# Patient Record
Sex: Female | Born: 2000 | Race: White | Hispanic: No | Marital: Single | Smoking: Current every day smoker
Health system: Southern US, Community
[De-identification: ages and names within clinical notes are randomized; demographics above are authoritative.]

## PROBLEM LIST (undated history)

## (undated) DIAGNOSIS — Z789 Other specified health status: Secondary | ICD-10-CM

## (undated) DIAGNOSIS — O24419 Gestational diabetes mellitus in pregnancy, unspecified control: Secondary | ICD-10-CM

## (undated) HISTORY — DX: Other specified health status: Z78.9

## (undated) HISTORY — PX: NO PAST SURGERIES: SHX2092

## (undated) HISTORY — DX: Gestational diabetes mellitus in pregnancy, unspecified control: O24.419

---
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
## 2018-03-31 ENCOUNTER — Encounter: Payer: Self-pay | Admitting: Obstetrics and Gynecology

## 2018-04-09 ENCOUNTER — Other Ambulatory Visit (HOSPITAL_COMMUNITY)
Admission: RE | Admit: 2018-04-09 | Discharge: 2018-04-09 | Disposition: A | Discharge status: Home / Self Care | Payer: BLUE CROSS/BLUE SHIELD | Source: Ambulatory Visit | Attending: Obstetrics and Gynecology | Admitting: Obstetrics and Gynecology

## 2018-04-09 ENCOUNTER — Encounter: Payer: Self-pay | Admitting: Obstetrics and Gynecology

## 2018-04-09 ENCOUNTER — Ambulatory Visit (INDEPENDENT_AMBULATORY_CARE_PROVIDER_SITE_OTHER): Payer: BLUE CROSS/BLUE SHIELD | Admitting: Obstetrics and Gynecology

## 2018-04-09 VITALS — BP 120/74 | HR 83 | Ht 68.0 in | Wt 183.0 lb

## 2018-04-09 DIAGNOSIS — Z113 Encounter for screening for infections with a predominantly sexual mode of transmission: Secondary | ICD-10-CM | POA: Diagnosis not present

## 2018-04-09 DIAGNOSIS — Z30431 Encounter for routine checking of intrauterine contraceptive device: Secondary | ICD-10-CM | POA: Diagnosis not present

## 2018-04-09 NOTE — Progress Notes (Signed)
Patient, No Pcp Per   Chief Complaint  Patient presents with  . Exposure to STD    HPI:      [name] is a 17 y.o. G0P0000 who LMP was Patient's last menstrual period was 03/20/2018 (approximate)., presents today for STD testing. No known exposures/sx, but just wants to be safe. No pelvic pain/vag sx. She is sex active, not using condoms. Has Verdia Kuba, doing well with it. No hx of STDs. Annual due later this month.   Past Medical History:  Diagnosis Date  . No known health problems     Past Surgical History:  Procedure Laterality Date  . NO PAST SURGERIES      Family History  Problem Relation Age of Onset  . Diabetes Mother   . Hypertension Mother   . Diabetes Father   . Hypertension Father   . Cancer Neg Hx   . Thyroid disease Neg Hx   . Hyperlipidemia Neg Hx     Social History   Socioeconomic History  . Marital status: Single    Spouse name: Not on file  . Number of children: 0  . Years of education: 83  . Highest education level: Not on file  Occupational History  . Not on file  Social Needs  . Financial resource strain: Not on file  . Food insecurity:    Worry: Not on file    Inability: Not on file  . Transportation needs:    Medical: Not on file    Non-medical: Not on file  Tobacco Use  . Smoking status: Current Every Day Smoker  . Smokeless tobacco: Never Used  . Tobacco comment: 6-7qd  Substance and Sexual Activity  . Alcohol use: No  . Drug use: No  . Sexual activity: Yes    Birth control/protection: IUD    Comment: [place]  . Physical activity:    Days per week: Not on file    Minutes per session: Not on file  . Stress: Not on file  Relationships  . Social connections:    Talks on phone: Not on file    Gets together: Not on file    Attends religious service: Not on file    Active member of club or organization: Not on file    Attends meetings of clubs or organizations: Not on file    Relationship status: Not  on file  . Intimate partner violence:    Fear of current or ex partner: Not on file    Emotionally abused: Not on file    Physically abused: Not on file    Forced sexual activity: Not on file  Other Topics Concern  . Not on file  Social History Narrative  . Not on file    Outpatient Medications Prior to Visit  Medication Sig Dispense Refill  . Levonorgestrel (KYLEENA) 19.5 MG IUD 19.5 mg by Intrauterine route once. 1 Intra Uterine Device 0   No facility-administered medications prior to visit.       ROS:  Review of Systems  Constitutional: Negative for fever.  Gastrointestinal: Negative for blood in stool, constipation, diarrhea, nausea and vomiting.  Genitourinary: Negative for dyspareunia, dysuria, flank pain, frequency, hematuria, urgency, vaginal bleeding, vaginal discharge and vaginal pain.  Musculoskeletal: Negative for back pain.  Skin: Negative for rash.   BREAST: No symptoms   OBJECTIVE:   Vitals:  BP 120/74   Pulse 83   Ht 5\' 8"  (1.727 m)   Wt 183 lb (  83 kg)   LMP 03/20/2018 (Approximate)   BMI 27.83 kg/m   Physical Exam  Constitutional: She is oriented to person, place, and time. Vital signs are normal. She appears well-developed.  Pulmonary/Chest: Effort normal.  Genitourinary: Vagina normal and uterus normal. There is no rash, tenderness or lesion on the right labia. There is no rash, tenderness or lesion on the left labia. Uterus is not enlarged and not tender. Cervix exhibits no motion tenderness. Right adnexum displays no mass and no tenderness. Left adnexum displays no mass and no tenderness. No erythema or tenderness in the vagina. No vaginal discharge found.  Genitourinary Comments: IUD STRINGS IN OS  Musculoskeletal: Normal range of motion.  Neurological: She is alert and oriented to person, place, and time.  Psychiatric: She has a normal mood and affect. Her behavior is normal. Thought content normal.  Vitals  reviewed.   Assessment/Plan: Screening for STD (sexually transmitted disease) - No sx. Will call with results.  - Plan: Cervicovaginal ancillary only, HIV antibody, RPR, Hepatitis C antibody  Encounter for routine checking of intrauterine contraceptive device (IUD) - IUD in place.    Return in about 1 month (around 02/03/6269) for annual.  Roberto Hlavaty B. Saphronia Ozdemir, PA-C 04/09/2018 10:36 AM

## 2018-04-09 NOTE — Patient Instructions (Signed)
I value your feedback and entrusting us with your care. If you get a [place] patient survey, I would appreciate you taking the time to let us know about your experience today. Thank you! 

## 2018-04-10 LAB — HIV ANTIBODY (ROUTINE TESTING W REFLEX): HIV SCREEN 4TH GENERATION: NONREACTIVE

## 2018-04-10 LAB — HEPATITIS C ANTIBODY

## 2018-04-10 LAB — CERVICOVAGINAL ANCILLARY ONLY
CHLAMYDIA, DNA PROBE: NEGATIVE
NEISSERIA GONORRHEA: NEGATIVE
Trichomonas: NEGATIVE

## 2018-04-10 LAB — RPR: RPR: NONREACTIVE

## 2018-12-10 ENCOUNTER — Other Ambulatory Visit: Payer: Self-pay

## 2018-12-10 ENCOUNTER — Emergency Department
Admission: EM | Admit: 2018-12-10 | Discharge: 2018-12-10 | Disposition: A | Discharge status: Home / Self Care | Payer: BLUE CROSS/BLUE SHIELD | Attending: Emergency Medicine | Admitting: Emergency Medicine

## 2018-12-10 ENCOUNTER — Encounter (HOSPITAL_COMMUNITY): Payer: Self-pay | Admitting: Emergency Medicine

## 2018-12-10 ENCOUNTER — Emergency Department (HOSPITAL_COMMUNITY)
Admission: EM | Admit: 2018-12-10 | Discharge: 2018-12-10 | Disposition: A | Discharge status: Home / Self Care | Payer: BLUE CROSS/BLUE SHIELD | Attending: Emergency Medicine | Admitting: Emergency Medicine

## 2018-12-10 DIAGNOSIS — F17200 Nicotine dependence, unspecified, uncomplicated: Secondary | ICD-10-CM | POA: Insufficient documentation

## 2018-12-10 DIAGNOSIS — Z5321 Procedure and treatment not carried out due to patient leaving prior to being seen by health care provider: Secondary | ICD-10-CM | POA: Insufficient documentation

## 2018-12-10 DIAGNOSIS — K625 Hemorrhage of anus and rectum: Secondary | ICD-10-CM | POA: Insufficient documentation

## 2018-12-10 DIAGNOSIS — M549 Dorsalgia, unspecified: Secondary | ICD-10-CM | POA: Insufficient documentation

## 2018-12-10 DIAGNOSIS — Z79899 Other long term (current) drug therapy: Secondary | ICD-10-CM | POA: Diagnosis not present

## 2018-12-10 LAB — COMPREHENSIVE METABOLIC PANEL
ALBUMIN: 4.6 g/dL (ref 3.5–5.0)
ALT: 15 U/L (ref 0–44)
ANION GAP: 7 (ref 5–15)
AST: 16 U/L (ref 15–41)
Alkaline Phosphatase: 46 U/L (ref 38–126)
BILIRUBIN TOTAL: 0.8 mg/dL (ref 0.3–1.2)
BUN: 14 mg/dL (ref 6–20)
CO2: 26 mmol/L (ref 22–32)
Calcium: 9.3 mg/dL (ref 8.9–10.3)
Chloride: 106 mmol/L (ref 98–111)
Creatinine, Ser: 0.63 mg/dL (ref 0.44–1.00)
GFR calc Af Amer: >60 mL/min (ref >60)
GFR calc non Af Amer: >60 mL/min (ref >60)
GLUCOSE: 97 mg/dL (ref 70–99)
POTASSIUM: 3.9 mmol/L (ref 3.5–5.1)
SODIUM: 139 mmol/L (ref 135–145)
Total Protein: 7.3 g/dL (ref 6.5–8.1)

## 2018-12-10 LAB — URINALYSIS, COMPLETE (UACMP) WITH MICROSCOPIC
BACTERIA UA: NONE SEEN
BILIRUBIN URINE: NEGATIVE
Glucose, UA: NEGATIVE mg/dL
Hgb urine dipstick: NEGATIVE
KETONES UR: NEGATIVE mg/dL
LEUKOCYTE UA: NEGATIVE
Nitrite: NEGATIVE
PROTEIN: NEGATIVE mg/dL
Specific Gravity, Urine: 1.021 (ref 1.005–1.030)
pH: 6 (ref 5.0–8.0)

## 2018-12-10 LAB — POCT PREGNANCY, URINE: PREG TEST UR: NEGATIVE

## 2018-12-10 LAB — CBC
HEMATOCRIT: 41.1 % (ref 36.0–46.0)
HEMOGLOBIN: 13.9 g/dL (ref 12.0–15.0)
MCH: 30.2 pg (ref 26.0–34.0)
MCHC: 33.8 g/dL (ref 30.0–36.0)
MCV: 89.2 fL (ref 80.0–100.0)
NRBC: 0 % (ref 0.0–0.2)
Platelets: 254 10*3/uL (ref 150–400)
RBC: 4.61 MIL/uL (ref 3.87–5.11)
RDW: 11.4 % — ABNORMAL LOW (ref 11.5–15.5)
WBC: 10.8 10*3/uL — AB (ref 4.0–10.5)

## 2018-12-10 MED ORDER — SUCRALFATE 1 G PO TABS: 1.0000 g | ORAL_TABLET | Freq: Four times a day (QID) | ORAL | 0 refills | Status: DC

## 2018-12-10 MED ORDER — DICYCLOMINE HCL 20 MG PO TABS: 20.0000 mg | ORAL_TABLET | Freq: Three times a day (TID) | ORAL | 0 refills | Status: DC | PRN

## 2018-12-10 NOTE — ED Triage Notes (Addendum)
Pt states she had 1 episode of rectal bleeding today x 1. States "it was toilet full of blood".  States no hx of the same states felt like it was watery stool, no recent illness. States did have some abd cramping yesterday not today. Denies any dysuria, or fever. Pt denies any rectal pain or hx of hemorrhoids.

## 2018-12-10 NOTE — ED Provider Notes (Signed)
[facility] Emergency Department Provider Note  ____________________________________________   I have reviewed the triage vital signs and the nursing notes.   HISTORY  Chief Complaint Rectal Bleeding   History limited by: Not Limited   HPI Dana Murphy is a 18 y.o. female who presents to the emergency department today because of concerns for an episode of rectal bleeding.  The patient states that she had one episode of bloody diarrhea today.  She is not had any further bowel movements.  She did not have any associated abdominal pain or cramping today although stated she had a small episode of cramping yesterday.  She denies any nausea or vomiting today.  Denies any fevers.  Denies any known sick contacts or recent travels or unusual ingestions.  Patient denies any history of IBS or Crohn's or any family history of the same.  She states she does not typically strain on the toilet and is not on the toilet for prolonged periods.  Past Medical History:  Diagnosis Date  . No known health problems     There are no active problems to display for this patient.   Past Surgical History:  Procedure Laterality Date  . NO PAST SURGERIES      Prior to Admission medications   Medication Sig Start Date End Date Taking? Authorizing Provider  Levonorgestrel (KYLEENA) 19.5 MG IUD 19.5 mg by Intrauterine route once. 04/27/17 04/13/44  Copland, Deirdre Evener, PA-C    Allergies Patient has no known allergies.  Family History  Problem Relation Age of Onset  . Diabetes Mother   . Hypertension Mother   . Diabetes Father   . Hypertension Father   . Cancer Neg Hx   . Thyroid disease Neg Hx   . Hyperlipidemia Neg Hx     Social History Social History   Tobacco Use  . Smoking status: Current Every Day Smoker  . Smokeless tobacco: Never Used  . Tobacco comment: 6-7qd  Substance Use Topics  . Alcohol use: No  . Drug use: No    Review of Systems Constitutional: No  fever/chills Eyes: No visual changes. ENT: No sore throat. Cardiovascular: Denies chest pain. Respiratory: Denies shortness of breath. Gastrointestinal: Positive for abdominal cramping yesterday. Positive for bloody diarrhea.    Genitourinary: Negative for dysuria. Musculoskeletal: Negative for back pain. Skin: Negative for rash. Neurological: Negative for headaches, focal weakness or numbness.  ____________________________________________   PHYSICAL EXAM:  VITAL SIGNS: ED Triage Vitals [12/10/18 2018]  Enc Vitals Group     BP 95/78     Pulse Rate 81     Resp 20     Temp 98.3 F (36.8 C)     Temp Source Oral     SpO2 100 %     Weight 185 lb (83.9 kg)     Height 5\' 8"  (1.727 m)     Head Circumference      Peak Flow      Pain Score 4   Constitutional: Alert and oriented.  Eyes: Conjunctivae are normal.  ENT      Head: Normocephalic and atraumatic.      Nose: No congestion/rhinnorhea.      Mouth/Throat: Mucous membranes are moist.      Neck: No stridor. Hematological/Lymphatic/Immunilogical: No cervical lymphadenopathy. Cardiovascular: Normal rate, regular rhythm.  No murmurs, rubs, or gallops. Respiratory: Normal respiratory effort without tachypnea nor retractions. Breath sounds are clear and equal bilaterally. No wheezes/rales/rhonchi. Gastrointestinal: Soft and non tender. No rebound. No guarding.  Rectal: No anal fissure. No hemorrhoid appreciated. No blood on glove. RN Neomia Glass present during rectal exam.  Musculoskeletal: Normal range of motion in all extremities. No lower extremity edema. Neurologic:  Normal speech and language. No gross focal neurologic deficits are appreciated.  Skin:  Skin is warm, dry and intact. No rash noted. Psychiatric: Mood and affect are normal. Speech and behavior are normal. Patient exhibits appropriate insight and judgment.  ____________________________________________    LABS (pertinent positives/negatives)  CMP wnl Upreg  negative UA hazy, 6-10 wbc, 6-10 squamous epithelial CBC wbc 10.8, hgb 13.9, plt 254 ____________________________________________   EKG  None  ____________________________________________    RADIOLOGY  None  ____________________________________________   PROCEDURES  Procedures  ____________________________________________   INITIAL IMPRESSION / ASSESSMENT AND PLAN / ED COURSE  Pertinent labs & imaging results that were available during my care of the patient were reviewed by me and considered in my medical decision making (see chart for details).   Patient presented to the emergency department today because of concerns for bloody diarrhea.  Patient had 1 isolated episode.  Patient's blood work without any concerning electrolyte abnormalities, leukocytosis or anemia.  No hemorrhoids or anal fissures appreciated on exam.  At this point we do not have patient had gastroenteritis or possible food poisoning.  I discussed this with patient.  Also discussed possibility of IBS or planus type disease although sounds like patient does not have any family history of personal history of similar symptoms..  At this point I feel it is reasonable for patient be discharged home.  I did discuss return precautions with patient.   ____________________________________________   FINAL CLINICAL IMPRESSION(S) / ED DIAGNOSES  Final diagnoses:  Rectal bleeding     Note: This dictation was prepared with Dragon dictation. Any transcriptional errors that result from this process are unintentional     Nance Pear, MD 12/10/18 [id number]

## 2018-12-10 NOTE — ED Triage Notes (Signed)
Pt. Stated, Im dripping blood from my rectum. Im having some back pain, and stomach cramping.

## 2018-12-10 NOTE — Discharge Instructions (Addendum)
Please seek medical attention for any high fevers, chest pain, shortness of breath, change in behavior, persistent vomiting, bloody stool or any other new or concerning symptoms.  

## 2020-02-13 NOTE — Progress Notes (Signed)
Chief Complaint  Patient presents with  . Gynecologic Exam     HPI:      [name] is a 19 y.o. G0P0000 who LMP was No LMP recorded. (Menstrual status: IUD)., presents today for her annual examination.  Her menses are infrequent spotting with IUD. Dysmenorrhea none.   Sex activity: single partner, contraception - Kyleena placed 04/27/17.  Last pap: N/A in past due to age; due today Hx of STDs: none  There is no FH of breast cancer. There is no FH of ovarian cancer. She does SBE.  Tobacco use: used to smoke cigs, now vapes Alcohol use: social drinker  No drug use. Exercise: min active  She does get adequate calcium but not Vitamin D in her diet. Has had wt gain.   Gardasil series not done.  Past Medical History:  Diagnosis Date  . No known health problems     Past Surgical History:  Procedure Laterality Date  . NO PAST SURGERIES      Family History  Problem Relation Age of Onset  . Diabetes Mother   . Hypertension Mother   . Diabetes Father   . Hypertension Father   . Cancer Neg Hx   . Thyroid disease Neg Hx   . Hyperlipidemia Neg Hx     Social History   Socioeconomic History  . Marital status: Single    Spouse name: Not on file  . Number of children: 0  . Years of education: 65  . Highest education level: Not on file  Occupational History  . Not on file  Tobacco Use  . Smoking status: Current Every Day Smoker  . Smokeless tobacco: Never Used  . Tobacco comment: 6-7qd  Substance and Sexual Activity  . Alcohol use: No  . Drug use: No  . Sexual activity: Yes    Birth control/protection: I.U.D.    Comment: Kylenna  Other Topics Concern  . Not on file  Social History Narrative  . Not on file   Social Determinants of Health   Financial Resource Strain:   . Difficulty of Paying Living Expenses:   Food Insecurity:   . Worried About Charity fundraiser in the Last Year:   . Arboriculturist in the Last Year:   Transportation Needs:   .  Film/video editor (Medical):   Marland Kitchen Lack of Transportation (Non-Medical):   Physical Activity:   . Days of Exercise per Week:   . Minutes of Exercise per Session:   Stress:   . Feeling of Stress :   Social Connections:   . Frequency of Communication with Friends and Family:   . Frequency of Social Gatherings with Friends and Family:   . Attends Religious Services:   . Active Member of Clubs or Organizations:   . Attends Archivist Meetings:   Marland Kitchen Marital Status:   Intimate Partner Violence:   . Fear of Current or Ex-Partner:   . Emotionally Abused:   Marland Kitchen Physically Abused:   . Sexually Abused:      Current Outpatient Medications:  .  Levonorgestrel (KYLEENA) 19.5 MG IUD, 19.5 mg by Intrauterine route once., Disp: 1 Intra Uterine Device, Rfl: 0  ROS:  Review of Systems  Constitutional: Negative for fatigue, fever and unexpected weight change.  Respiratory: Negative for cough, shortness of breath and wheezing.   Cardiovascular: Negative for chest pain, palpitations and leg swelling.  Gastrointestinal: Negative for blood in stool, constipation, diarrhea, nausea and vomiting.  Endocrine: Negative for cold intolerance, heat intolerance and polyuria.  Genitourinary: Negative for dyspareunia, dysuria, flank pain, frequency, genital sores, hematuria, menstrual problem, pelvic pain, urgency, vaginal bleeding, vaginal discharge and vaginal pain.  Musculoskeletal: Negative for back pain, joint swelling and myalgias.  Skin: Negative for rash.  Neurological: Positive for headaches. Negative for dizziness, syncope, light-headedness and numbness.  Hematological: Negative for adenopathy.  Psychiatric/Behavioral: Negative for agitation, confusion, sleep disturbance and suicidal ideas. The patient is not nervous/anxious.      Objective: BP 120/80   Ht 5\' 9"  (1.753 m)   Wt 207 lb (93.9 kg)   BMI 30.57 kg/m    Physical Exam Constitutional:      Appearance: She is  well-developed.  Genitourinary:     Vulva, vagina, uterus, right adnexa and left adnexa normal.     No vulval lesion or tenderness noted.     No vaginal discharge, erythema or tenderness.     No cervical motion tenderness or polyp.     Uterus is not enlarged or tender.     No right or left adnexal mass present.     Right adnexa not tender.     Left adnexa not tender.  Neck:     Thyroid: Thyromegaly present.  Cardiovascular:     Rate and Rhythm: Normal rate and regular rhythm.     Heart sounds: Normal heart sounds. No murmur.  Pulmonary:     Effort: Pulmonary effort is normal.     Breath sounds: Normal breath sounds.  Chest:     Breasts:        Right: No mass, nipple discharge, skin change or tenderness.        Left: No mass, nipple discharge, skin change or tenderness.  Abdominal:     Palpations: Abdomen is soft.     Tenderness: There is no abdominal tenderness. There is no guarding.  Musculoskeletal:        General: Normal range of motion.     Cervical back: Normal range of motion.  Neurological:     General: No focal deficit present.     Mental Status: She is alert and oriented to person, place, and time.     Cranial Nerves: No cranial nerve deficit.  Skin:    General: Skin is warm and dry.  Psychiatric:        Mood and Affect: Mood normal.        Behavior: Behavior normal.        Thought Content: Thought content normal.        Judgment: Judgment normal.  Vitals reviewed.     Assessment/Plan: Encounter for annual routine gynecological examination  Cervical cancer screening - Plan: Cytology - PAP  Screening for STD (sexually transmitted disease) - Plan: Cytology - PAP  Encounter for routine checking of intrauterine contraceptive device (IUD); IUD in place. Due for rem 7/23.  Thyroid disorder screening - Plan: TSH + free T4  Enlarged thyroid--check labs. If neg, will check thyroid u/s. Has had wt gain.             GYN counsel adequate intake of calcium and  vitamin D, Gardasil vaccine.  Return in about 1 year (around 02/16/2021).   Kenlee Maler B. Yuvonne Lanahan, PA-C 02/17/2020 9:08 AM

## 2020-02-17 ENCOUNTER — Other Ambulatory Visit: Payer: Self-pay

## 2020-02-17 ENCOUNTER — Ambulatory Visit (INDEPENDENT_AMBULATORY_CARE_PROVIDER_SITE_OTHER): Payer: BC Managed Care – PPO | Admitting: Obstetrics and Gynecology

## 2020-02-17 ENCOUNTER — Encounter: Payer: Self-pay | Admitting: Obstetrics and Gynecology

## 2020-02-17 ENCOUNTER — Other Ambulatory Visit (HOSPITAL_COMMUNITY)
Admission: RE | Admit: 2020-02-17 | Discharge: 2020-02-17 | Disposition: A | Discharge status: Home / Self Care | Payer: BC Managed Care – PPO | Source: Ambulatory Visit | Attending: Obstetrics and Gynecology | Admitting: Obstetrics and Gynecology

## 2020-02-17 VITALS — BP 120/80 | Ht 69.0 in | Wt 207.0 lb

## 2020-02-17 DIAGNOSIS — Z124 Encounter for screening for malignant neoplasm of cervix: Secondary | ICD-10-CM

## 2020-02-17 DIAGNOSIS — Z1329 Encounter for screening for other suspected endocrine disorder: Secondary | ICD-10-CM

## 2020-02-17 DIAGNOSIS — Z01419 Encounter for gynecological examination (general) (routine) without abnormal findings: Secondary | ICD-10-CM

## 2020-02-17 DIAGNOSIS — Z113 Encounter for screening for infections with a predominantly sexual mode of transmission: Secondary | ICD-10-CM

## 2020-02-17 DIAGNOSIS — Z30431 Encounter for routine checking of intrauterine contraceptive device: Secondary | ICD-10-CM

## 2020-02-17 DIAGNOSIS — E049 Nontoxic goiter, unspecified: Secondary | ICD-10-CM

## 2020-02-17 NOTE — Patient Instructions (Signed)
I value your feedback and entrusting us with your care. If you get a [facility] patient survey, I would appreciate you taking the time to let us know about your experience today. Thank you!  As of September 12, 2019, your lab results will be released to your MyChart immediately, before I even have a chance to see them. Please give me time to review them and contact you if there are any abnormalities. Thank you for your patience.  

## 2020-02-18 LAB — CYTOLOGY - PAP
Chlamydia: NEGATIVE
Comment: NEGATIVE
Comment: NORMAL
Diagnosis: NEGATIVE
Neisseria Gonorrhea: NEGATIVE

## 2020-02-18 LAB — TSH+FREE T4
Free T4: 1.05 ng/dL (ref 0.82–1.77)
TSH: 2 u[IU]/mL (ref 0.450–4.500)

## 2020-02-18 NOTE — Addendum Note (Signed)
Addended by: Ardeth Perfect B on: 0000000 10:38 AM   Modules accepted: Orders

## 2020-02-26 ENCOUNTER — Ambulatory Visit
Admission: RE | Admit: 2020-02-26 | Discharge: 2020-02-26 | Disposition: A | Discharge status: Home / Self Care | Payer: BC Managed Care – PPO | Source: Ambulatory Visit | Attending: Obstetrics and Gynecology | Admitting: Obstetrics and Gynecology

## 2020-02-26 ENCOUNTER — Telehealth: Payer: Self-pay | Admitting: Obstetrics and Gynecology

## 2020-02-26 ENCOUNTER — Other Ambulatory Visit: Payer: Self-pay

## 2020-02-26 DIAGNOSIS — E042 Nontoxic multinodular goiter: Secondary | ICD-10-CM | POA: Insufficient documentation

## 2020-02-26 DIAGNOSIS — E041 Nontoxic single thyroid nodule: Secondary | ICD-10-CM | POA: Insufficient documentation

## 2020-02-26 DIAGNOSIS — E049 Nontoxic goiter, unspecified: Secondary | ICD-10-CM | POA: Diagnosis not present

## 2020-02-26 NOTE — Telephone Encounter (Signed)
Pt aware of enlarged thyroid and nodules on thyroid u/s. Bx recommended. Refer to ENT for further eval/mgmt.

## 2020-03-04 ENCOUNTER — Other Ambulatory Visit: Payer: Self-pay | Admitting: Physician Assistant

## 2020-03-04 ENCOUNTER — Other Ambulatory Visit (HOSPITAL_COMMUNITY): Payer: Self-pay | Admitting: Physician Assistant

## 2020-03-04 DIAGNOSIS — E049 Nontoxic goiter, unspecified: Secondary | ICD-10-CM

## 2020-03-09 ENCOUNTER — Other Ambulatory Visit: Payer: Self-pay | Admitting: Physician Assistant

## 2020-03-09 DIAGNOSIS — D359 Benign neoplasm of endocrine gland, unspecified: Secondary | ICD-10-CM

## 2020-03-09 DIAGNOSIS — D351 Benign neoplasm of parathyroid gland: Secondary | ICD-10-CM

## 2020-03-09 DIAGNOSIS — R591 Generalized enlarged lymph nodes: Secondary | ICD-10-CM

## 2020-03-16 ENCOUNTER — Other Ambulatory Visit: Payer: Self-pay

## 2020-03-16 ENCOUNTER — Ambulatory Visit
Admission: RE | Admit: 2020-03-16 | Discharge: 2020-03-16 | Disposition: A | Discharge status: Home / Self Care | Payer: BC Managed Care – PPO | Source: Ambulatory Visit | Attending: Physician Assistant | Admitting: Physician Assistant

## 2020-03-16 DIAGNOSIS — D351 Benign neoplasm of parathyroid gland: Secondary | ICD-10-CM

## 2020-03-16 DIAGNOSIS — R591 Generalized enlarged lymph nodes: Secondary | ICD-10-CM

## 2020-03-16 MED ORDER — IOHEXOL 300 MG/ML  SOLN
75.0000 mL | Freq: Once | INTRAMUSCULAR | Status: AC | PRN
  Administered 2020-03-16: 75 mL via INTRAVENOUS

## 2020-03-18 ENCOUNTER — Other Ambulatory Visit: Payer: Self-pay | Admitting: Physician Assistant

## 2020-03-18 DIAGNOSIS — E041 Nontoxic single thyroid nodule: Secondary | ICD-10-CM

## 2020-03-25 ENCOUNTER — Ambulatory Visit
Admission: RE | Admit: 2020-03-25 | Discharge: 2020-03-25 | Disposition: A | Discharge status: Home / Self Care | Payer: BC Managed Care – PPO | Source: Ambulatory Visit | Attending: Physician Assistant | Admitting: Physician Assistant

## 2020-03-25 ENCOUNTER — Other Ambulatory Visit: Payer: Self-pay

## 2020-03-25 ENCOUNTER — Other Ambulatory Visit: Payer: Self-pay | Admitting: Physician Assistant

## 2020-03-25 DIAGNOSIS — E041 Nontoxic single thyroid nodule: Secondary | ICD-10-CM

## 2020-03-25 DIAGNOSIS — E042 Nontoxic multinodular goiter: Secondary | ICD-10-CM | POA: Diagnosis not present

## 2020-03-25 NOTE — Discharge Instructions (Signed)
Thyroid Needle Biopsy, Care After This sheet gives you information about how to care for yourself after your procedure. Your health care provider may also give you more specific instructions. If you have problems or questions, contact your health care provider. What can I expect after the procedure? After the procedure, it is common to have:  Soreness and tenderness that lasts for a few days.  Bruising where the needle was inserted (puncture site). Follow these instructions at home:   Take over-the-counter and prescription medicines only as told by your health care provider.  To help ease discomfort, keep your head raised (elevated) when you are lying down. When you move from lying down to sitting up, use both hands to support the back of your head and neck.  Check your puncture site every day for signs of infection. Check for: ? Redness, swelling, or pain. ? Fluid or blood. ? Warmth. ? Pus or a bad smell.  Return to your normal activities as told by your health care provider. Ask your health care provider what activities are safe for you.  Keep all follow-up visits as told by your health care provider. This is important. Contact a health care provider if:  You have redness, swelling, or pain around your puncture site.  You have fluid or blood coming from your puncture site.  Your puncture site feels warm to the touch.  You have pus or a bad smell coming from your puncture site.  You have a fever. Get help right away if:  You have severe bleeding from the puncture site.  You have difficulty swallowing.  You have swollen glands (lymph nodes) in your neck. Summary  It is common to have some bruising and soreness where the needle was inserted in your lower front neck area (puncture site).  Check your puncture site every day for signs of infection, such as redness, swelling, or pain.  Get help right away if you have severe bleeding from your puncture site. This  information is not intended to replace advice given to you by your health care provider. Make sure you discuss any questions you have with your health care provider. Document Revised: 09/01/2017 Document Reviewed: 07/03/2017 Elsevier Patient Education  2020 [employer]  

## 2020-03-25 NOTE — Procedures (Signed)
Pre Procedure Dx: Indeterminate bilateral thyroid nodules Post Procedural Dx: Same  Technically successful US guided biopsy of dominant indeterminate bilateral thyroid nodules.   EBL: None  No immediate complications.   Ronny Bacon, MD Pager #: [phone]

## 2020-03-27 LAB — CYTOLOGY - NON PAP

## 2021-03-04 ENCOUNTER — Ambulatory Visit: Payer: Self-pay | Attending: Physician Assistant

## 2021-03-11 NOTE — Progress Notes (Signed)
   Chief Complaint  Patient presents with   IUD removal    Not interested in new [facility]     History of Present Illness:  Dana Murphy is a 20 y.o. that had a  [place]  IUD placed approximately 4 years ago. Since that time, she denies dyspareunia, pelvic pain, non-menstrual bleeding, vaginal d/c, heavy bleeding. She would like to conceive in the next yr or so and wants removed now. Plans to use condoms.  Neg pap/STD testing 5/21.  BP 110/70   Ht 5\' 9"  (1.753 m)   Wt 239 lb (108.4 kg)   BMI 35.29 kg/m   Pelvic exam:  Two IUD strings present seen coming from the cervical os. EGBUS, vaginal vault and cervix: within normal limits  IUD Removal Strings of IUD identified and grasped.  IUD removed without problem with ring forceps.  Pt tolerated this well.  IUD noted to be intact.  Assessment:  Encounter for IUD removal  Screening for STD (sexually transmitted disease) - Plan: Other/Misc lab test; MDL test  PNV start 3 months before trying to conceive. F/u prn.   Plan: IUD removed and plan for contraception is condoms. She was amenable to this plan.  PT IS SELF PAY  Younique Casad B. Rodman Recupero, PA-C 03/22/2021 9:12 AM

## 2021-03-22 ENCOUNTER — Other Ambulatory Visit: Payer: Self-pay | Admitting: Obstetrics and Gynecology

## 2021-03-22 ENCOUNTER — Other Ambulatory Visit: Payer: Self-pay

## 2021-03-22 ENCOUNTER — Ambulatory Visit (INDEPENDENT_AMBULATORY_CARE_PROVIDER_SITE_OTHER): Payer: Self-pay | Admitting: Obstetrics and Gynecology

## 2021-03-22 ENCOUNTER — Encounter: Payer: Self-pay | Admitting: Obstetrics and Gynecology

## 2021-03-22 VITALS — BP 110/70 | Ht 69.0 in | Wt 239.0 lb

## 2021-03-22 DIAGNOSIS — Z30432 Encounter for removal of intrauterine contraceptive device: Secondary | ICD-10-CM

## 2021-03-22 DIAGNOSIS — Z113 Encounter for screening for infections with a predominantly sexual mode of transmission: Secondary | ICD-10-CM

## 2021-03-25 ENCOUNTER — Encounter: Payer: Self-pay | Admitting: Obstetrics and Gynecology

## 2021-06-17 ENCOUNTER — Telehealth: Payer: Self-pay

## 2021-06-17 NOTE — Telephone Encounter (Signed)
Pt calling; has NOB on 27th; is having very light spotting that started yesterday morning and gradually getting worse.  [phone]  Pt admits to IC 24-48 hours before spotting started.  Adv probably from that.  Pt aware if becomes like a period flow to be seen.

## 2021-06-20 ENCOUNTER — Other Ambulatory Visit: Payer: Self-pay

## 2021-06-20 ENCOUNTER — Encounter: Payer: Self-pay | Admitting: Emergency Medicine

## 2021-06-20 DIAGNOSIS — Z3A01 Less than 8 weeks gestation of pregnancy: Secondary | ICD-10-CM | POA: Diagnosis not present

## 2021-06-20 DIAGNOSIS — O200 Threatened abortion: Secondary | ICD-10-CM | POA: Diagnosis not present

## 2021-06-20 DIAGNOSIS — N939 Abnormal uterine and vaginal bleeding, unspecified: Secondary | ICD-10-CM | POA: Diagnosis not present

## 2021-06-20 DIAGNOSIS — O209 Hemorrhage in early pregnancy, unspecified: Secondary | ICD-10-CM | POA: Diagnosis not present

## 2021-06-20 DIAGNOSIS — F17200 Nicotine dependence, unspecified, uncomplicated: Secondary | ICD-10-CM | POA: Insufficient documentation

## 2021-06-20 LAB — CBC WITH DIFFERENTIAL/PLATELET
Abs Immature Granulocytes: 0.04 10*3/uL (ref 0.00–0.07)
Basophils Absolute: 0.1 10*3/uL (ref 0.0–0.1)
Basophils Relative: 0 %
Eosinophils Absolute: 0.7 10*3/uL — ABNORMAL HIGH (ref 0.0–0.5)
Eosinophils Relative: 6 %
HCT: 40.2 % (ref 36.0–46.0)
Hemoglobin: 14 g/dL (ref 12.0–15.0)
Immature Granulocytes: 0 %
Lymphocytes Relative: 25 %
Lymphs Abs: 3.3 10*3/uL (ref 0.7–4.0)
MCH: 31 pg (ref 26.0–34.0)
MCHC: 34.8 g/dL (ref 30.0–36.0)
MCV: 88.9 fL (ref 80.0–100.0)
Monocytes Absolute: 0.8 10*3/uL (ref 0.1–1.0)
Monocytes Relative: 6 %
Neutro Abs: 8.1 10*3/uL — ABNORMAL HIGH (ref 1.7–7.7)
Neutrophils Relative %: 63 %
Platelets: 288 10*3/uL (ref 150–400)
RBC: 4.52 MIL/uL (ref 3.87–5.11)
RDW: 11.8 % (ref 11.5–15.5)
WBC: 13 10*3/uL — ABNORMAL HIGH (ref 4.0–10.5)
nRBC: 0 % (ref 0.0–0.2)

## 2021-06-20 LAB — ABO/RH: ABO/RH(D): A POS

## 2021-06-20 NOTE — ED Triage Notes (Signed)
Pt states is [redacted] weeks pregnant and having vaginal bleeding. Pt states has used one pad today. Pt appears in no acute distress.

## 2021-06-21 ENCOUNTER — Emergency Department
Admission: EM | Admit: 2021-06-21 | Discharge: 2021-06-21 | Disposition: A | Discharge status: Home / Self Care | Payer: BC Managed Care – PPO | Attending: Emergency Medicine | Admitting: Emergency Medicine

## 2021-06-21 ENCOUNTER — Emergency Department: Payer: BC Managed Care – PPO

## 2021-06-21 DIAGNOSIS — N939 Abnormal uterine and vaginal bleeding, unspecified: Secondary | ICD-10-CM | POA: Diagnosis not present

## 2021-06-21 DIAGNOSIS — O4690 Antepartum hemorrhage, unspecified, unspecified trimester: Secondary | ICD-10-CM

## 2021-06-21 DIAGNOSIS — Z3A01 Less than 8 weeks gestation of pregnancy: Secondary | ICD-10-CM | POA: Diagnosis not present

## 2021-06-21 DIAGNOSIS — O200 Threatened abortion: Secondary | ICD-10-CM | POA: Diagnosis not present

## 2021-06-21 DIAGNOSIS — O209 Hemorrhage in early pregnancy, unspecified: Secondary | ICD-10-CM | POA: Diagnosis not present

## 2021-06-21 LAB — HCG, QUANTITATIVE, PREGNANCY: hCG, Beta Chain, Quant, S: 1895 m[IU]/mL — ABNORMAL HIGH (ref <5)

## 2021-06-21 NOTE — ED Provider Notes (Signed)
[facility] Emergency Department Provider Note  ____________________________________________  Time seen: Approximately 3:17 AM  I have reviewed the triage vital signs and the nursing notes.   HISTORY  Chief Complaint Vaginal Bleeding   HPI Dana Murphy is a 20 y.o. female G1 P0 at estimated 6 weeks pregnancy per LMP who presents for evaluation of vaginal bleeding.  Patient reports the vaginal bleeding started today.  She reports that she is bleeding a little less than a period and has passed some clots.  She denies abdominal pain, dizziness, syncope, chest pain or shortness of breath.  Patient has not established care for this pregnancy.  Past Medical History:  Diagnosis Date   No pertinent past medical history     Patient Active Problem List   Diagnosis Date Noted   Thyroid nodule 02/26/2020   Enlarged thyroid 02/26/2020    Past Surgical History:  Procedure Laterality Date   NO PAST SURGERIES      Prior to Admission medications   Not on File    Allergies Patient has no known allergies.  Family History  Problem Relation Age of Onset   Diabetes Mother    Hypertension Mother    Diabetes Father    Hypertension Father    Cancer Neg Hx    Thyroid disease Neg Hx    Hyperlipidemia Neg Hx     Social History Social History   Tobacco Use   Smoking status: Every Day   Smokeless tobacco: Never   Tobacco comments:    6-7qd  Vaping Use   Vaping Use: Some days  Substance Use Topics   Alcohol use: No   Drug use: No    Review of Systems  Constitutional: Negative for fever. Eyes: Negative for visual changes. ENT: Negative for sore throat. Neck: No neck pain  Cardiovascular: Negative for chest pain. Respiratory: Negative for shortness of breath. Gastrointestinal: Negative for abdominal pain, vomiting or diarrhea. Genitourinary: Negative for dysuria. + vaginal bleeding Musculoskeletal: Negative for back pain. Skin: Negative for  rash. Neurological: Negative for headaches, weakness or numbness. Psych: No SI or HI  ____________________________________________   PHYSICAL EXAM:  VITAL SIGNS: ED Triage Vitals  Enc Vitals Group     BP 06/20/21 2304 140/84     Pulse Rate 06/20/21 2304 100     Resp 06/20/21 2304 16     Temp 06/20/21 2304 98.2 F (36.8 C)     Temp Source 06/20/21 2304 Oral     SpO2 06/20/21 2304 98 %     Weight 06/20/21 2305 240 lb (108.9 kg)     Height 06/20/21 2305 5\' 9"  (1.753 m)     Head Circumference --      Peak Flow --      Pain Score 06/20/21 2304 0     Pain Loc --      Pain Edu? --      Excl. in [place]? --     Constitutional: Alert and oriented. Well appearing and in no apparent distress. HEENT:      Head: Normocephalic and atraumatic.         Eyes: Conjunctivae are normal. Sclera is non-icteric.       Mouth/Throat: Mucous membranes are moist.       Neck: Supple with no signs of meningismus. Cardiovascular: Regular rate and rhythm. No murmurs, gallops, or rubs. 2+ symmetrical distal pulses are present in all extremities. No JVD. Respiratory: Normal respiratory effort. Lungs are clear to auscultation bilaterally.  Gastrointestinal:  Soft, non tender, and non distended with positive bowel sounds. No rebound or guarding. Genitourinary: No CVA tenderness. Musculoskeletal:  No edema, cyanosis, or erythema of extremities. Neurologic: Normal speech and language. Face is symmetric. Moving all extremities. No gross focal neurologic deficits are appreciated. Skin: Skin is warm, dry and intact. No rash noted. Psychiatric: Mood and affect are normal. Speech and behavior are normal.  ____________________________________________   LABS (all labs ordered are listed, but only abnormal results are displayed)  Labs Reviewed  CBC WITH DIFFERENTIAL/PLATELET - Abnormal; Notable for the following components:      Result Value   WBC 13.0 (*)    Neutro Abs 8.1 (*)    Eosinophils Absolute 0.7 (*)     All other components within normal limits  HCG, QUANTITATIVE, PREGNANCY - Abnormal; Notable for the following components:   hCG, Beta Chain, Quant, S 1,895 (*)    All other components within normal limits  ABO/RH   ____________________________________________  EKG  none  ____________________________________________  RADIOLOGY  I have personally reviewed the images performed during this visit and I agree with the Radiologist's read.   Interpretation by Radiologist:  US OB LESS THAN 14 WEEKS WITH OB TRANSVAGINAL  Result Date: 06/21/2021 CLINICAL DATA:  Vaginal bleeding for 4 days. Estimated gestational age by LMP is 7 weeks 6 days. Quantitative beta HCG is 1,895 EXAM: OBSTETRIC <14 WK [place] AND TRANSVAGINAL OB US TECHNIQUE: Both transabdominal and transvaginal ultrasound examinations were performed for complete evaluation of the gestation as well as the maternal uterus, adnexal regions, and pelvic cul-de-sac. Transvaginal technique was performed to assess early pregnancy. COMPARISON:  None. FINDINGS: Intrauterine gestational sac: A single intrauterine gestational sac is identified. Yolk sac:  Not Visualized. Embryo:  Not Visualized. Cardiac Activity: Not Visualized. MSD: 5 mm   5 w   2 d Subchorionic hemorrhage:  None visualized. Maternal uterus/adnexae: The uterus is anteverted. Endometrium is expanded, likely gestational reaction. No myometrial mass lesions are identified. Both ovaries are visualized and appear normal. No abnormal adnexal masses. No free pelvic fluid collections. IMPRESSION: Probable early intrauterine gestational sac, but no yolk sac, fetal pole, or cardiac activity yet visualized. Recommend follow-up quantitative B-HCG levels and follow-up US in 14 days to assess viability. This recommendation follows SRU consensus guidelines: Diagnostic Criteria for Nonviable Pregnancy Early in the First Trimester. [employer] Med 2013; 938:1829-93. Electronically Signed   By: Lucienne Capers M.D.    On: 06/21/2021 01:13     ____________________________________________   PROCEDURES  Procedure(s) performed: None Procedures Critical Care performed:  None ____________________________________________   INITIAL IMPRESSION / ASSESSMENT AND PLAN / ED COURSE  20 y.o. female G1 P0 at estimated 6 weeks pregnancy per LMP who presents for evaluation of vaginal bleeding.  Patient is hemodynamically stable, well-appearing, with soft nontender abdomen, normal hemoglobin on blood work.  hCG of 1895 with an ultrasound showing a 5-week 2-day probably early intrauterine gestational sac but no fetal pole.  Ddx early pregnancy versus failed pregnancy versus threatened miscarriage.  Discussed this findings with patient and recommended follow-up with her OB in 7 to 10 days for repeat ultrasound and hCG testing.  Rh+ no indication for RhoGAM.  Discussed pelvic rest and signs and symptoms of acute blood loss anemia.  Recommended return to the emergency room if these develop.      _____________________________________________ Please note:  Patient was evaluated in Emergency Department today for the symptoms described in the history of present illness. Patient was evaluated in the  context of the global COVID-19 pandemic, which necessitated consideration that the patient might be at risk for infection with the SARS-CoV-2 virus that causes COVID-19. Institutional protocols and algorithms that pertain to the evaluation of patients at risk for COVID-19 are in a state of rapid change based on information released by regulatory bodies including the CDC and federal and state organizations. These policies and algorithms were followed during the patient's care in the ED.  Some ED evaluations and interventions may be delayed as a result of limited staffing during the pandemic.   [place] Controlled Substance Database was reviewed by me. ____________________________________________   FINAL CLINICAL IMPRESSION(S) / ED  DIAGNOSES   Final diagnoses:  Threatened miscarriage      NEW MEDICATIONS STARTED DURING THIS VISIT:  ED Discharge Orders     None        Note:  This document was prepared using Dragon voice recognition software and may include unintentional dictation errors.    Alfred Levins, [place], MD 06/21/21 [phone]

## 2021-06-23 ENCOUNTER — Telehealth: Payer: Self-pay

## 2021-06-23 NOTE — Telephone Encounter (Signed)
Pt calling; has appt on the 27th; recently in ED for bleeding/clotting; clots are now bigger and the cramping is worse.  [phone]  Pt states bleeding is a normal flow; she is NOT saturating a pad q27min-1hr; adv she can take 600mg  Ibu q6hrs or 800mg  q8hrs, heating pad, lie on a pillow.  She states she isn't allergic to IBU but every time she takes it she get a cold.  Adv it she does saturate a pad [id number] it's our policy that she go the to ED.

## 2021-06-29 ENCOUNTER — Ambulatory Visit (INDEPENDENT_AMBULATORY_CARE_PROVIDER_SITE_OTHER): Payer: BC Managed Care – PPO | Admitting: Obstetrics

## 2021-06-29 ENCOUNTER — Encounter: Payer: Self-pay | Admitting: Obstetrics

## 2021-06-29 ENCOUNTER — Other Ambulatory Visit: Payer: Self-pay

## 2021-06-29 VITALS — BP 122/70 | Ht 69.0 in | Wt 244.8 lb

## 2021-06-29 DIAGNOSIS — O4690 Antepartum hemorrhage, unspecified, unspecified trimester: Secondary | ICD-10-CM | POA: Diagnosis not present

## 2021-06-29 DIAGNOSIS — O209 Hemorrhage in early pregnancy, unspecified: Secondary | ICD-10-CM | POA: Diagnosis not present

## 2021-06-29 DIAGNOSIS — O039 Complete or unspecified spontaneous abortion without complication: Secondary | ICD-10-CM

## 2021-06-29 DIAGNOSIS — Z3A09 9 weeks gestation of pregnancy: Secondary | ICD-10-CM

## 2021-06-29 LAB — POCT URINALYSIS DIPSTICK OB
Glucose, UA: NEGATIVE
POC,PROTEIN,UA: NEGATIVE

## 2021-06-29 LAB — POCT URINE PREGNANCY: Preg Test, Ur: POSITIVE — AB

## 2021-06-29 NOTE — Progress Notes (Signed)
po

## 2021-06-30 LAB — BETA HCG QUANT (REF LAB): hCG Quant: 1026 m[IU]/mL

## 2021-07-01 ENCOUNTER — Other Ambulatory Visit (HOSPITAL_COMMUNITY): Payer: Self-pay | Admitting: Internal Medicine

## 2021-07-02 ENCOUNTER — Other Ambulatory Visit: Payer: Self-pay | Admitting: Obstetrics

## 2021-07-02 DIAGNOSIS — Z3A09 9 weeks gestation of pregnancy: Secondary | ICD-10-CM

## 2021-07-02 NOTE — Progress Notes (Signed)
Obstetrics & Gynecology Office Visit   Chief Complaint:  Chief Complaint  Patient presents with   Initial Prenatal Visit    History of Present Illness: Dana Murphy is a 20 year old patient being seen for bleeding in early pregnancy. She reports an LMP of 04/27/2021, and shares that she has had some vaginal bleeding (light) off and on over the last few weeks. At our visit today, she c/o only light spotting. Of note, she had an IUD removed on 04/21/21, then had a period on 04/27/2021. A pregnancy test on 7/31 was positive. She believes that she started to see spotting on 06/14/2021 which progressed from bright red to brown and then to small clots. She visited the [facility] ED on 06/21/2021 with this complaint, and anultrasound at that time made note of an empty gestational sac, no fetal pole nor cardiac activity, and the MSD was 5 w 2 days. She was referred to [facility] for follow up.   Review of Systems:  Review of Systems  Constitutional: Negative.   HENT: Negative.    Eyes: Negative.   Respiratory: Negative.    Cardiovascular: Negative.   Gastrointestinal: Negative.   Genitourinary: Negative.   Musculoskeletal: Negative.   Skin: Negative.   Neurological: Negative.   Endo/Heme/Allergies: Negative.   Psychiatric/Behavioral: Negative.      Past Medical History:  Past Medical History:  Diagnosis Date   No pertinent past medical history     Past Surgical History:  Past Surgical History:  Procedure Laterality Date   NO PAST SURGERIES      Gynecologic History: Patient's last menstrual period was 04/27/2021.  Obstetric History: G1P0000  Family History:  Family History  Problem Relation Age of Onset   Diabetes Mother    Hypertension Mother    Diabetes Father    Hypertension Father    Cancer Neg Hx    Thyroid disease Neg Hx    Hyperlipidemia Neg Hx     Social History:  Social History   Socioeconomic History   Marital status: Single    Spouse name: Not on file   Number of  children: 0   Years of education: 12   Highest education level: Not on file  Occupational History   Not on file  Tobacco Use   Smoking status: Every Day   Smokeless tobacco: Never   Tobacco comments:    6-7qd  Vaping Use   Vaping Use: Some days  Substance and Sexual Activity   Alcohol use: No   Drug use: No   Sexual activity: Yes    Birth control/protection: I.U.D.    Comment: [place]  Other Topics Concern   Not on file  Social History Narrative   Not on file   Social Determinants of Health   Financial Resource Strain: Not on file  Food Insecurity: Not on file  Transportation Needs: Not on file  Physical Activity: Not on file  Stress: Not on file  Social Connections: Not on file  Intimate Partner Violence: Not on file    Allergies:  No Known Allergies  Medications: Prior to Admission medications   Not on File    Physical Exam Vitals:  Vitals:   06/29/21 0940  BP: 122/70   Patient's last menstrual period was 04/27/2021.  Physical Exam Constitutional:      Appearance: Normal appearance. She is obese.  HENT:     Head: Normocephalic and atraumatic.  Cardiovascular:     Rate and Rhythm: Normal rate and regular rhythm.  Pulmonary:  Effort: Pulmonary effort is normal.     Breath sounds: Normal breath sounds.  Abdominal:     Palpations: Abdomen is soft.  Genitourinary:    General: Normal vulva.     Comments: No exteranl rashes or lesions. Normal vaginal rugae. Cervical os visualized with scan t dark red blood noted in the vault. Uterus is anteverted, non enlarged and non tender Skin:    General: Skin is warm and dry.  Neurological:     General: No focal deficit present.     Mental Status: She is alert and oriented to person, place, and time.  Psychiatric:        Mood and Affect: Mood normal.        Behavior: Behavior normal.     Assessment: 20 y.o. G1P0000 vaginal bleeding in early pregnancy R/O SAB  Plan: Problem List Items Addressed This  Visit       Other   Bleeding in early pregnancy   Other Visit Diagnoses     [redacted] weeks gestation of pregnancy    -  Primary   Relevant Orders   POC Urinalysis Dipstick OB (Completed)   Beta hCG quant (ref lab) (Completed)   US OB Comp Less 14 Wks   Vaginal bleeding in pregnancy       Relevant Orders   POCT urine pregnancy (Completed)   Beta hCG quant (ref lab) (Completed)   US OB Comp Less 14 Wks      We discussed the possibility of an early SAB, and will draw a Quantitative HCG today and in 48 hours. Will consider a repeat ultrasound  should the quants increase in value.she is advised to return to the ED for any heavy bleeding.  Imagene Riches, CNM  07/02/2021 5:08 PM

## 2021-07-05 ENCOUNTER — Other Ambulatory Visit: Payer: BC Managed Care – PPO

## 2021-07-05 ENCOUNTER — Other Ambulatory Visit: Payer: Self-pay | Admitting: Obstetrics

## 2021-07-05 ENCOUNTER — Other Ambulatory Visit: Payer: Self-pay

## 2021-07-05 DIAGNOSIS — O4690 Antepartum hemorrhage, unspecified, unspecified trimester: Secondary | ICD-10-CM | POA: Diagnosis not present

## 2021-07-06 ENCOUNTER — Other Ambulatory Visit: Payer: Self-pay | Admitting: Obstetrics

## 2021-07-06 DIAGNOSIS — O4690 Antepartum hemorrhage, unspecified, unspecified trimester: Secondary | ICD-10-CM

## 2021-07-06 LAB — BETA HCG QUANT (REF LAB): hCG Quant: 638 m[IU]/mL

## 2021-07-08 ENCOUNTER — Ambulatory Visit: Payer: BC Managed Care – PPO

## 2021-07-20 ENCOUNTER — Other Ambulatory Visit: Payer: Self-pay | Admitting: Obstetrics

## 2021-07-20 DIAGNOSIS — O4690 Antepartum hemorrhage, unspecified, unspecified trimester: Secondary | ICD-10-CM

## 2021-07-21 ENCOUNTER — Other Ambulatory Visit: Payer: BC Managed Care – PPO

## 2021-07-21 ENCOUNTER — Other Ambulatory Visit: Payer: Self-pay

## 2021-07-21 DIAGNOSIS — N939 Abnormal uterine and vaginal bleeding, unspecified: Secondary | ICD-10-CM | POA: Diagnosis not present

## 2021-07-21 DIAGNOSIS — O4690 Antepartum hemorrhage, unspecified, unspecified trimester: Secondary | ICD-10-CM | POA: Diagnosis not present

## 2021-07-21 DIAGNOSIS — N912 Amenorrhea, unspecified: Secondary | ICD-10-CM | POA: Diagnosis not present

## 2021-07-22 LAB — BETA HCG QUANT (REF LAB): hCG Quant: 91 m[IU]/mL

## 2021-10-15 ENCOUNTER — Other Ambulatory Visit: Payer: Self-pay | Admitting: Obstetrics

## 2021-10-28 ENCOUNTER — Telehealth: Payer: BC Managed Care – PPO | Admitting: Physician Assistant

## 2021-10-28 DIAGNOSIS — J029 Acute pharyngitis, unspecified: Secondary | ICD-10-CM | POA: Diagnosis not present

## 2021-10-28 NOTE — Progress Notes (Signed)
I have spent 5 minutes in review of e-visit questionnaire, review and updating patient chart, medical decision making and response to patient.   Sederick Jacobsen Cody Jago Carton, PA-C    

## 2021-10-28 NOTE — Progress Notes (Signed)
°  E-Visit for Sore Throat  We are sorry that you are not feeling well.  Here is how we plan to help!  Your symptoms indicate a likely viral infection (Pharyngitis).   Pharyngitis is inflammation in the back of the throat which can cause a sore throat, scratchiness and sometimes difficulty swallowing.   Pharyngitis is typically caused by a respiratory virus and will just run its course.  Please keep in mind that your symptoms could last up to 10 days.  For throat pain, we recommend over the counter oral pain relief medications such as acetaminophen or aspirin, or anti-inflammatory medications such as ibuprofen or naproxen sodium.  Topical treatments such as oral throat lozenges or sprays may be used as needed.  Avoid close contact with loved ones, especially the very young and elderly.  Remember to wash your hands thoroughly throughout the day as this is the number one way to prevent the spread of infection and wipe down door knobs and counters with disinfectant.  After careful review of your answers, I would not recommend an antibiotic for your condition.  Antibiotics should not be used to treat conditions that we suspect are caused by viruses like the virus that causes the common cold or flu. However, some people can have Strep with atypical symptoms. You may need formal testing in clinic or office to confirm if your symptoms continue or worsen.  Providers prescribe antibiotics to treat infections caused by bacteria. Antibiotics are very powerful in treating bacterial infections when they are used properly.  To maintain their effectiveness, they should be used only when necessary.  Overuse of antibiotics has resulted in the development of super bugs that are resistant to treatment!    Home Care: Only take medications as instructed by your medical team. Do not drink alcohol while taking these medications. A steam or ultrasonic humidifier can help congestion.  You can place a towel over your head and  breathe in the steam from hot water coming from a faucet. Avoid close contacts especially the very young and the elderly. Cover your mouth when you cough or sneeze. Always remember to wash your hands.  Get Help Right Away If: You develop worsening fever or throat pain. You develop a severe head ache or visual changes. Your symptoms persist after you have completed your treatment plan.  Make sure you Understand these instructions. Will watch your condition. Will get help right away if you are not doing well or get worse.   Thank you for choosing an e-visit.  Your e-visit answers were reviewed by a board certified advanced clinical practitioner to complete your personal care plan. Depending upon the condition, your plan could have included both over the counter or prescription medications.  Please review your pharmacy choice. Make sure the pharmacy is open so you can pick up prescription now. If there is a problem, you may contact your provider through [employer] and have the prescription routed to another pharmacy.  Your safety is important to [place]. If you have drug allergies check your prescription carefully.   For the next 24 hours you can use MyChart to ask questions about today's visit, request a non-urgent call back, or ask for a work or school excuse. You will get an email in the next two days asking about your experience. I hope that your e-visit has been valuable and will speed your recovery.

## 2021-11-26 IMAGING — CT CT NECK W/ CM
5 series · 16 of 33 positions shown, 18 images · IV contrast (omnipaque)
Comparison: Thyroid ultrasound 02/26/2020

CLINICAL DATA: Asymmetric enlargement of the thyroid gland. Weight
gain.

EXAM:
CT NECK WITH CONTRAST
TECHNIQUE: Multidetector CT imaging of the neck was performed using the
standard protocol following the bolus administration of intravenous
contrast.
CONTRAST:  75mL OMNIPAQUE IOHEXOL 300 MG/ML  SOLN

[Series 2: axial neck neck (person_name) 2.00 · axial · 0.63mm/px · z∈[-665,-581]mm · 2 of 128 slices shown]
[im 43/128  bone]
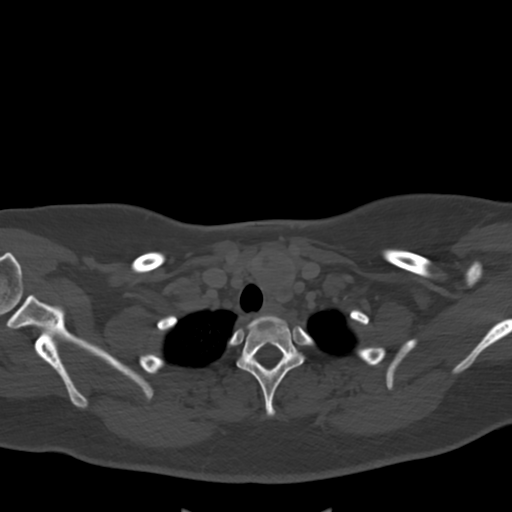
[im 85/128  bone]
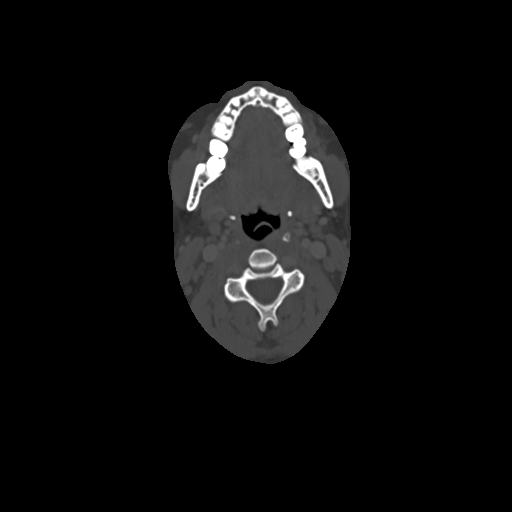

[Series 3: axial bone neck 2.00 · axial · 0.63mm/px · z∈[-665,-581]mm · 2 of 128 slices shown]
[im 43/128  bone]
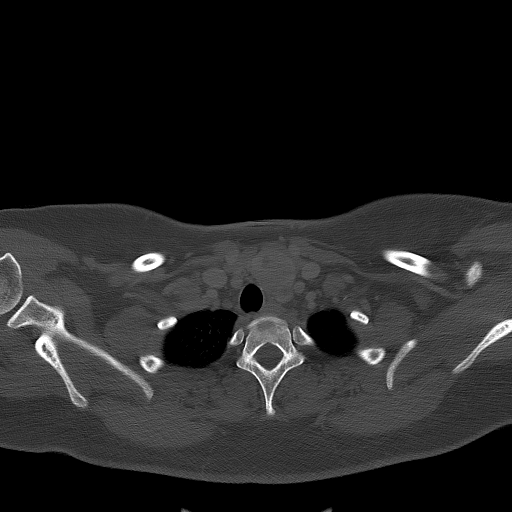
[im 85/128  bone]
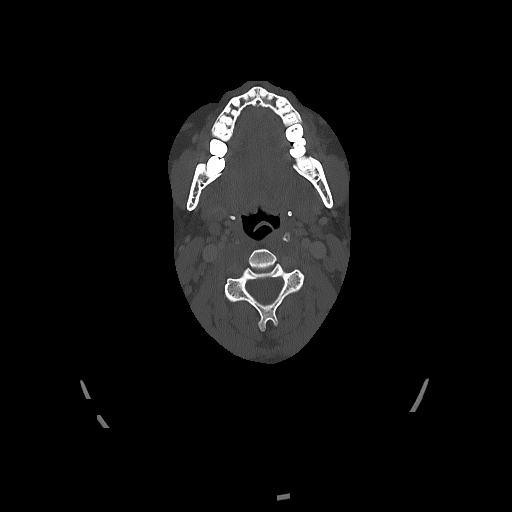

[Series 4: coronal neck neck (person_name) 2.00 cor · coronal · 0.59mm/px · 3 of 149 slices shown]
[im 56/149  bone]
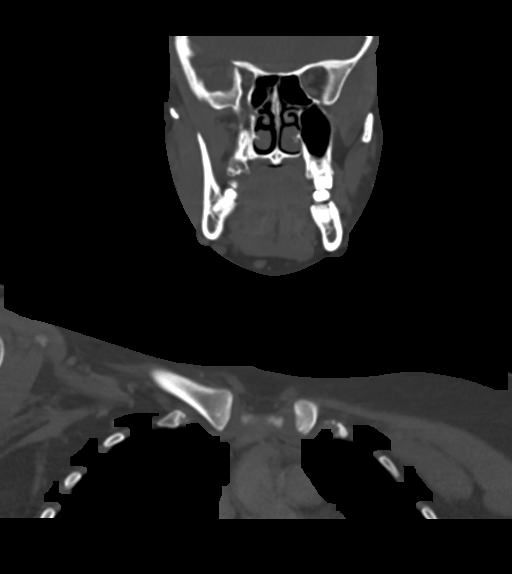
[im 68/149  bone]
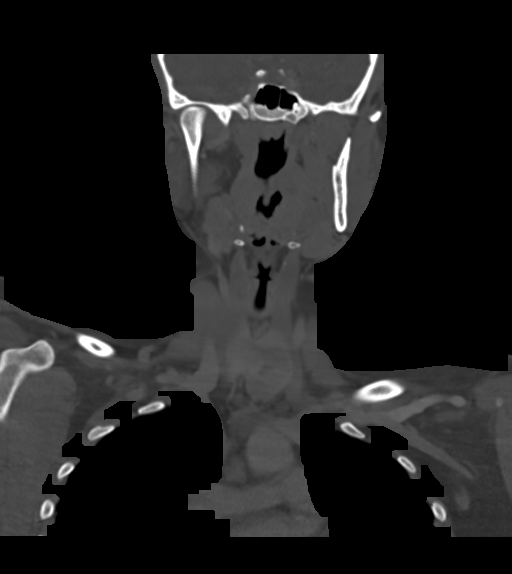
[im 81/149  bone]
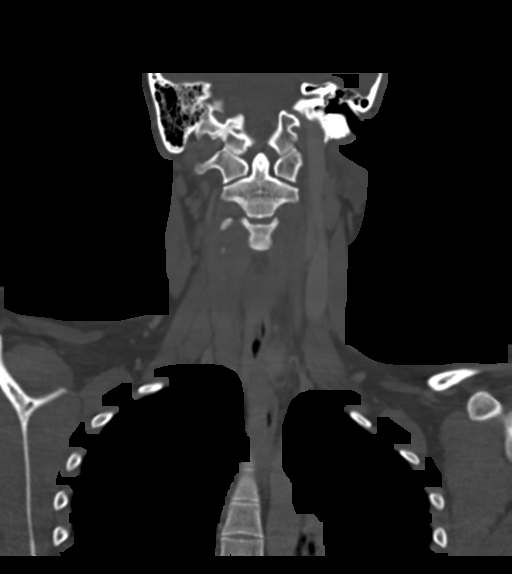

[Series 6: sagittal neck neck (person_name) 2.00 sag · sagittal · 0.58mm/px · 5 of 149 slices shown, 6 images]
[im 50/149  bone]
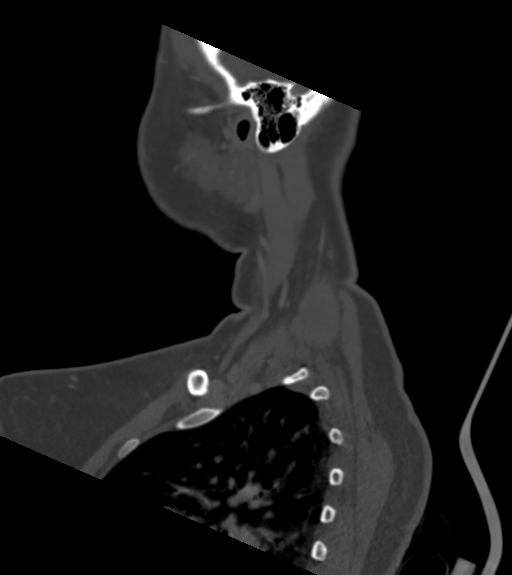
[im 62/149  bone]
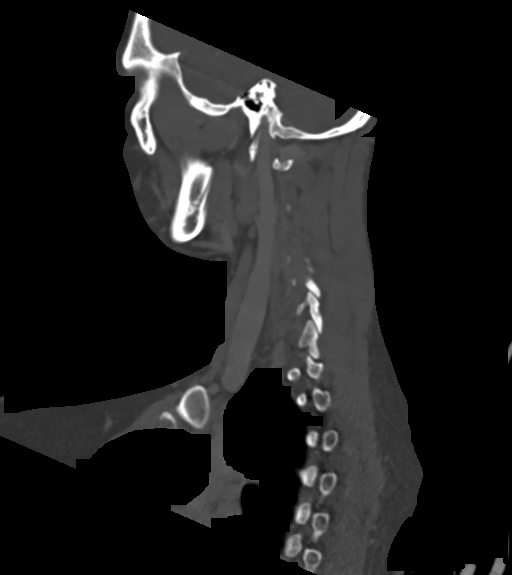
[im 75/149  soft-tissue]
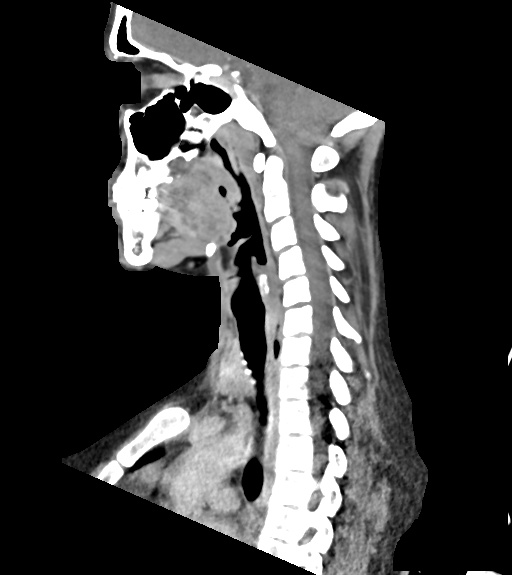
[im 75/149  bone]
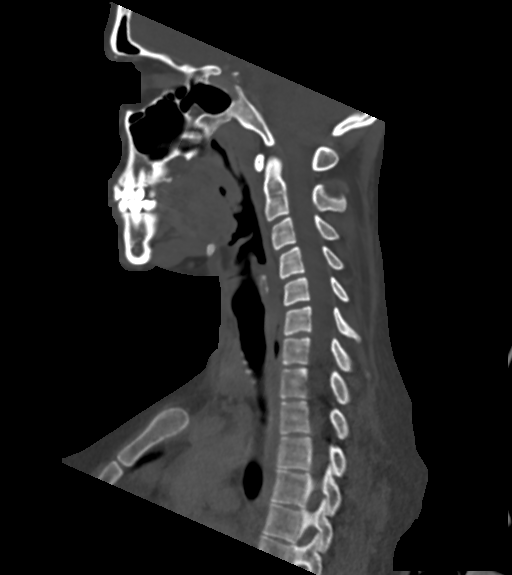
[im 87/149  bone]
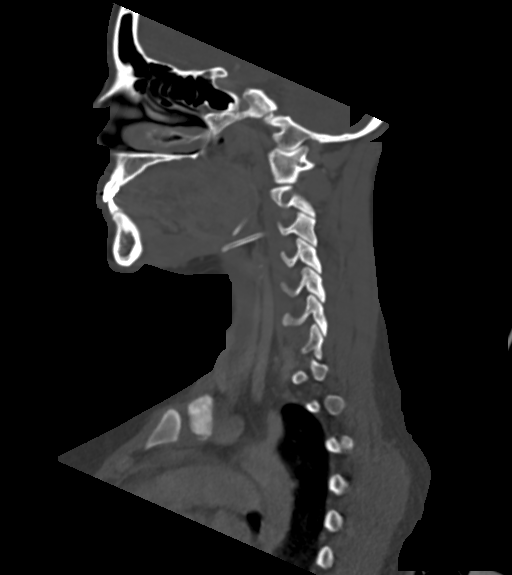
[im 99/149  bone]
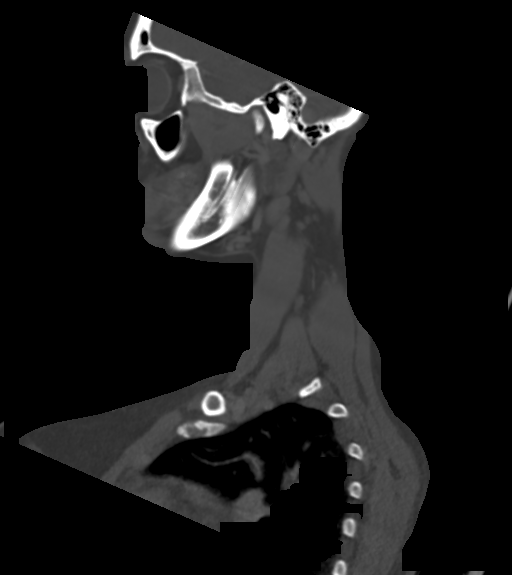

[Series 8: ax oropharynx neck neck (person_name) 2.00 ax · axial · 0.58mm/px · z∈[-762,-581]mm · 4 of 167 slices shown, 5 images]
[im 34/167  soft-tissue]
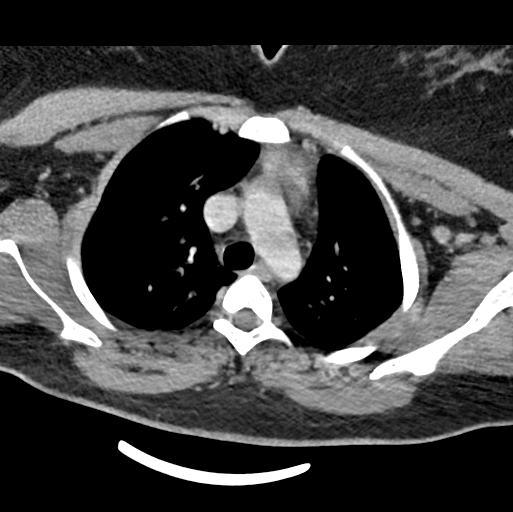
[im 34/167  bone]
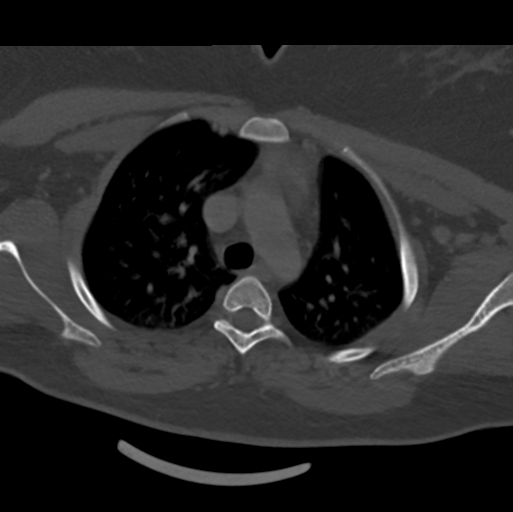
[im 67/167  bone]
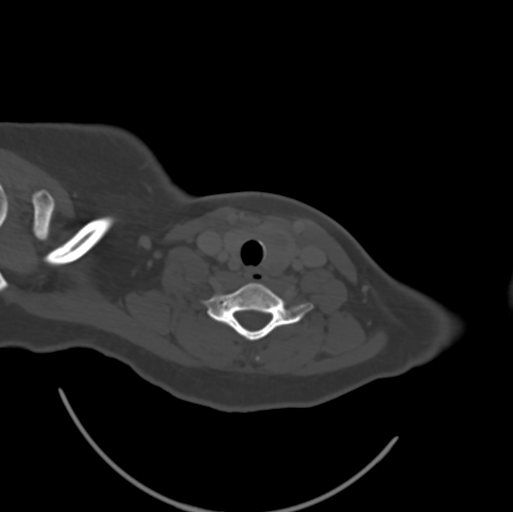
[im 100/167  bone]
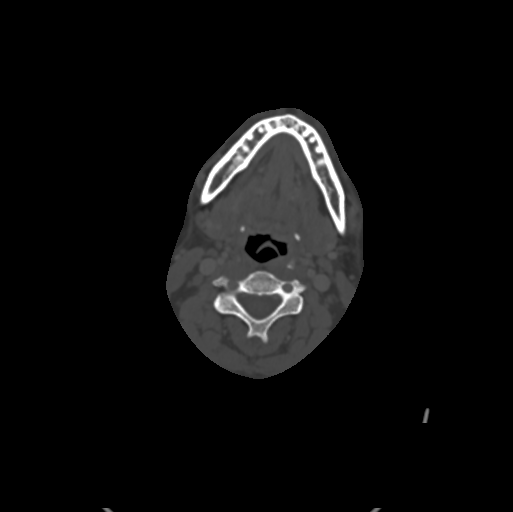
[im 133/167  bone]
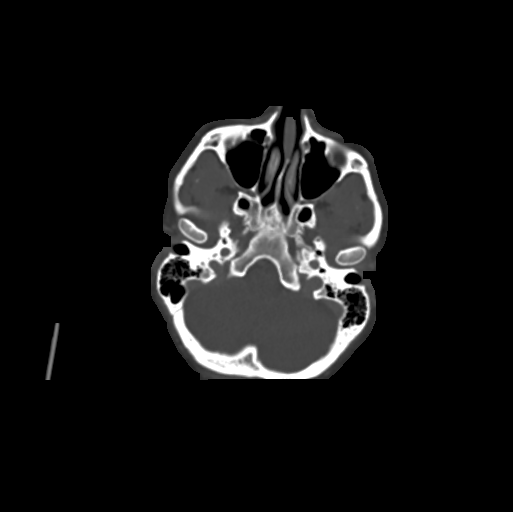

[16 of 33 positions shown; findings below may reference images not displayed]

FINDINGS: Pharynx and larynx: No focal mucosal or submucosal lesions are
present. Nasopharynx is clear. Soft palate and tongue base are
within normal limits. Vallecular and epiglottis are normal. The
hypopharynx is clear. Vocal cords are midline and symmetric. The
trachea is within normal limits.

Salivary glands: Intraparotid lymph nodes are present on the right.
Parotid glands and ducts are otherwise within normal limits. The
submandibular glands and ducts are within normal limits.

Thyroid: Heterogeneous multinodular goiter is again demonstrated.
The dominant nodule is in the left lobe measuring 3.4 x 2.4 x
cm. The posterior nodule in the right is contiguous with the
thyroid. No airway compromise is present.

Lymph nodes: Subcentimeter level 2 lymph nodes bilaterally are
within normal limits. No significant adenopathy is present.

Vascular: Negative

Limited intracranial: Within normal limits.

Visualized orbits: The globes and orbits are within normal limits.

Mastoids and visualized paranasal sinuses: The paranasal sinuses and
mastoid air cells are clear.

Skeleton: Vertebral body heights and alignment are normal.
Straightening of the normal cervical lordosis is noted.

Upper chest: The lung apices are clear. Thoracic inlet is within
normal limits.

Other:
IMPRESSION: 1. Heterogeneous multinodular goiter. The dominant nodule is in the
left lobe measuring 3.4 x 2.4 x 4.6 cm. The posterior nodule in the
right lobe is contiguous with the thyroid. No airway compromise is
present. Recommend thyroid US (ref: [HOSPITAL]. 3085
2. No significant adenopathy.
3. No other acute or focal lesion to explain the patient's symptoms.

## 2021-12-05 IMAGING — US US FNA BIOPSY THYROID 1ST LESION
1 series · 12 of 25 positions shown · non-contrast
Comparison: Thyroid ultrasound-02/26/2020; neck CT-03/16/2020

MEDICATIONS:
None

COMPLICATIONS:
None immediate.

INDICATION: Indeterminate thyroid nodules

EXAM:
ULTRASOUND GUIDED FINE NEEDLE ASPIRATION OF INDETERMINATE THYROID
NODULE
TECHNIQUE: Informed written consent was obtained from the patient after a
discussion of the risks, benefits and alternatives to treatment.
Questions regarding the procedure were encouraged and answered. A
timeout was performed prior to the initiation of the procedure.

[Series 1: us fna biopsy thyroid 1st lesion · 0.06mm/px · 12 of 28 slices shown]
[im 2/28]
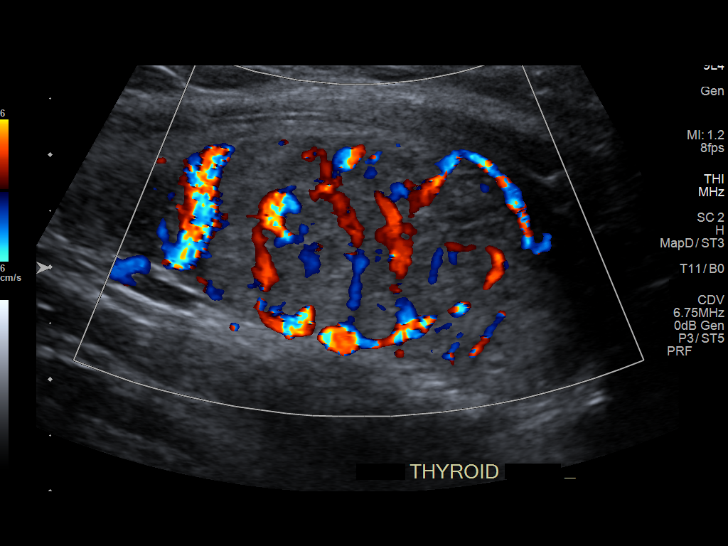
[im 4/28]
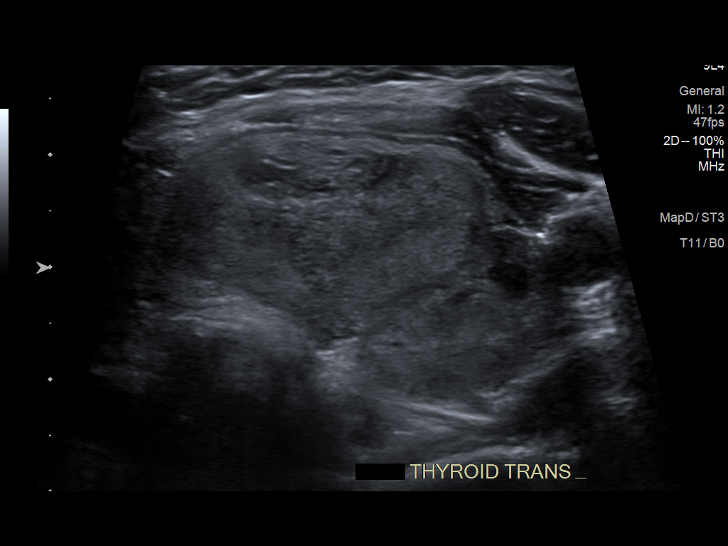
[im 6/28]
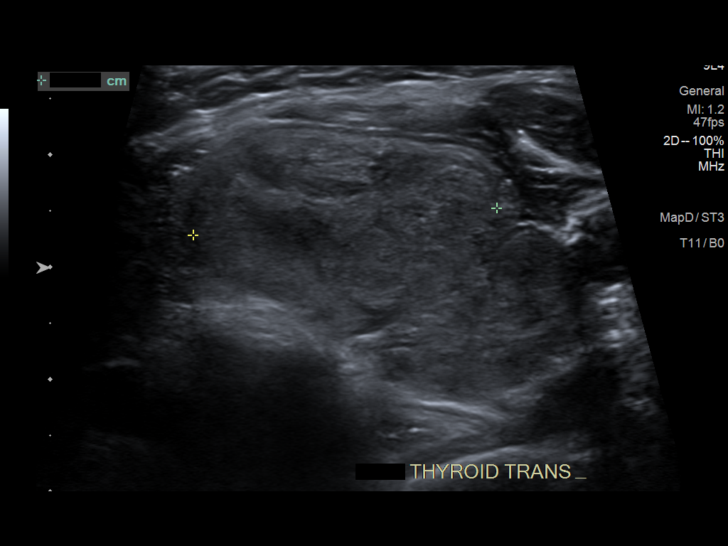
[im 8/28]
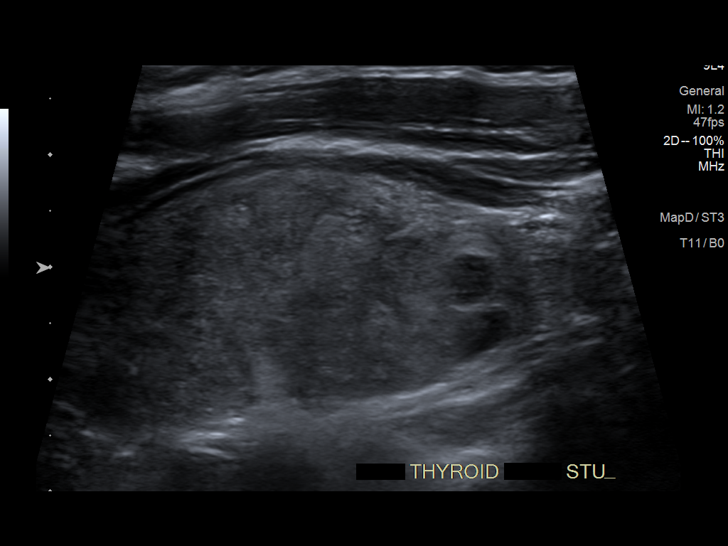
[im 11/28]
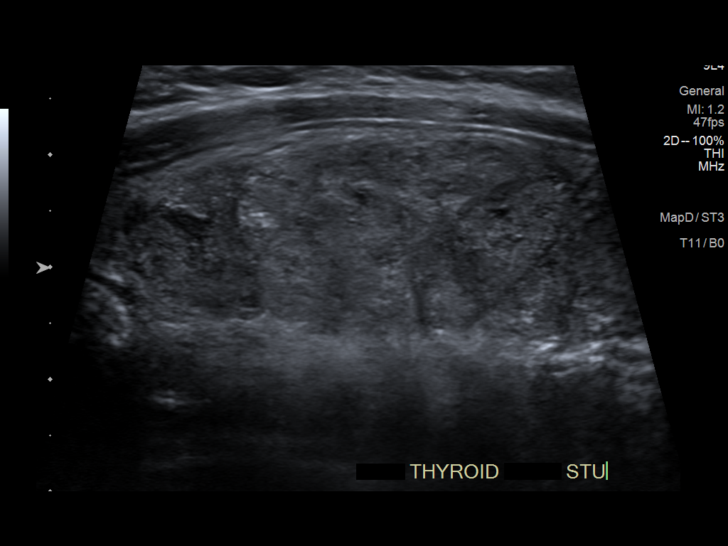
[im 13/28]
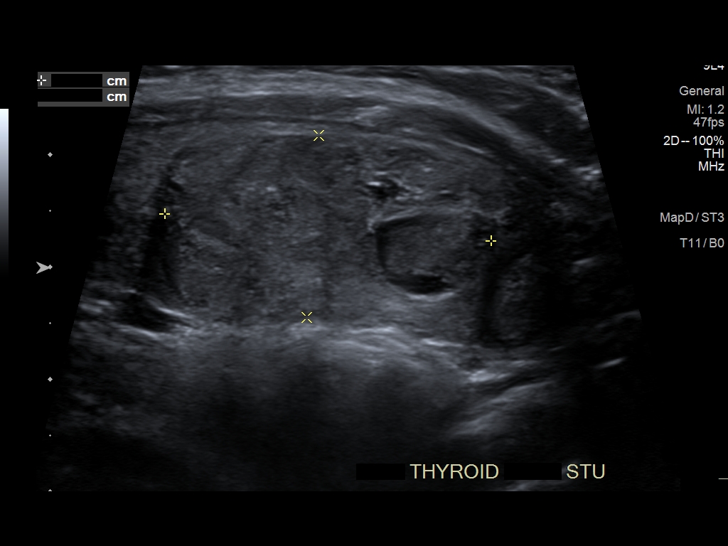
[im 15/28]
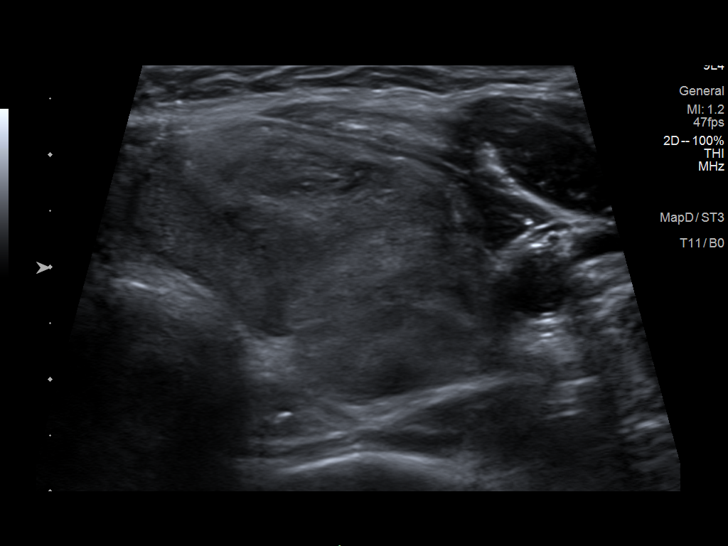
[im 17/28]
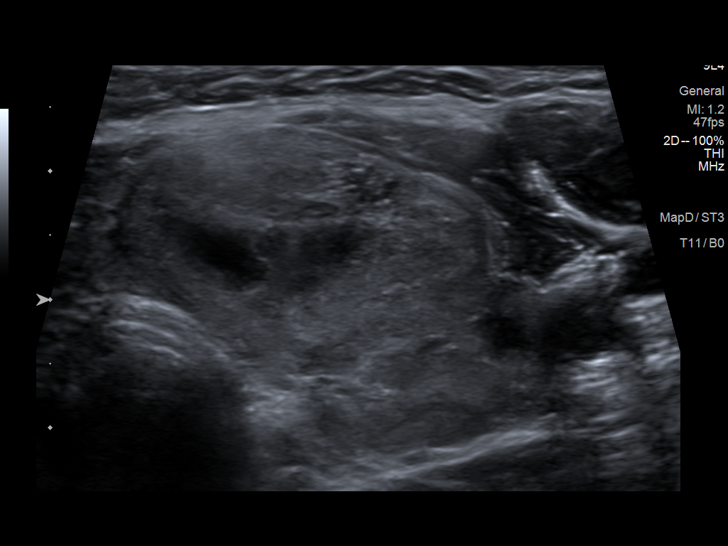
[im 20/28]
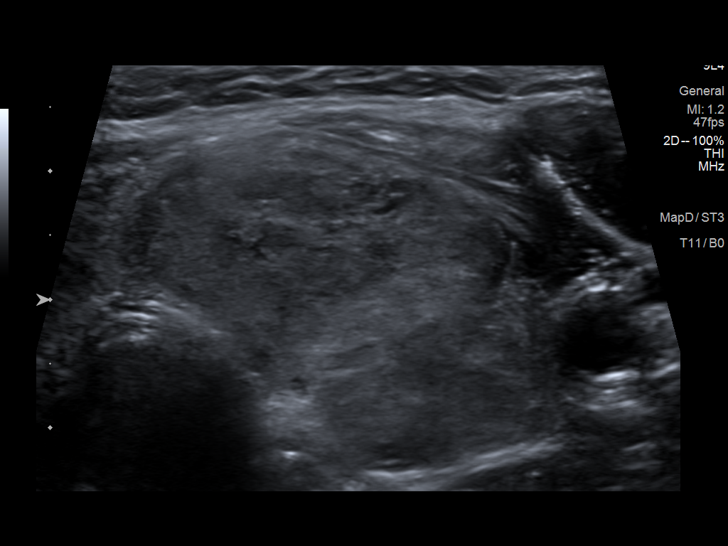
[im 22/28]
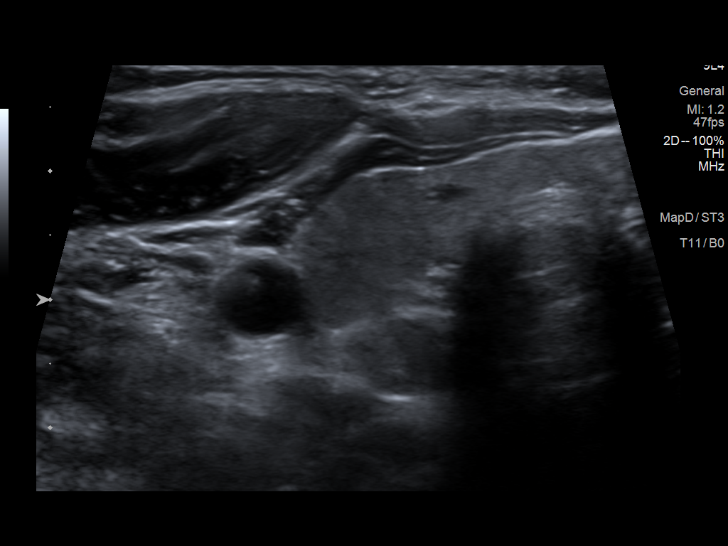
[im 24/28]
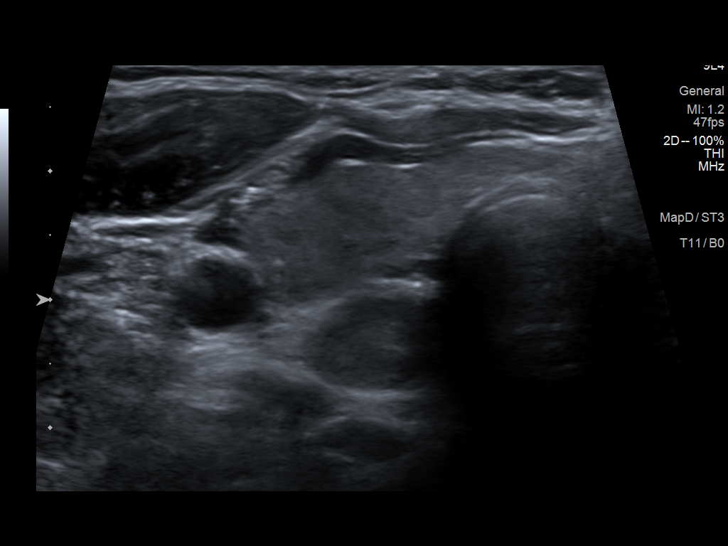
[im 26/28]
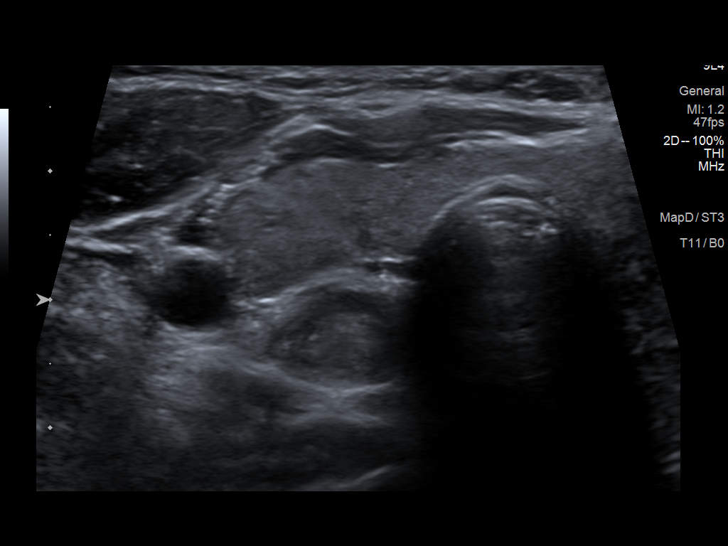

[12 of 25 positions shown; findings below may reference images not displayed]

Pre-procedural ultrasound scanning demonstrated unchanged size and
appearance of the indeterminate nodule within the left lobe of the
thyroid measuring approximately 3.0 x 2.7 x 1.9 cm

Review of interval neck CT suggests previously question nodule
adjacent to the mid, posterior aspect the right lobe of the thyroid
is actually contained within the thyroid parenchyma and as such,
this nodule would be characterized as a TR 4 nodule and would meet
imaging criteria to undergo percutaneous sampling

Sonographic evaluation demonstrates unchanged size and appearance of
the approximately 1.5 cm nodule in the decision was made to pursue
ultrasound-guided fine-needle aspiration of this nodule as well.

The procedures were planned. The neck was prepped in the usual
sterile fashion, and a sterile drape was applied covering the
operative field. A timeout was performed prior to the initiation of
the procedure. Local anesthesia was provided with 1% lidocaine.

Under direct ultrasound guidance, 5 FNA biopsies were performed of
the left-sided thyroid nodule with a 25 gauge needle. Multiple
ultrasound images were saved for procedural documentation purposes.
The samples were prepared and submitted to pathology. A sample was
set aside for potential molecular testing

Under direct ultrasound guidance, 5 FNA biopsies were performed of
the right-sided thyroid nodule with a 25 gauge needle. Multiple
ultrasound images were saved for procedural documentation purposes.
The samples were prepared and submitted to pathology. A sample was
set aside for potential molecular testing.

Limited post procedural scanning was negative for hematoma or
additional complication. Dressings were placed. The patient
tolerated the above procedures procedure well without immediate
postprocedural complication.
FINDINGS: Nodule reference number based on prior diagnostic ultrasound: 1

Maximum size:

Location: Right; Mid

ACR TI-RADS risk category: TR4 (4-6 points)

Reason for biopsy: meets ACR TI-RADS criteria

_________________________________________________________

Nodule reference number based on prior diagnostic ultrasound: 2

Maximum size:

Location: Left; Mid

ACR TI-RADS risk category: TR3 (3 points)

Reason for biopsy: meets ACR TI-RADS criteria

Ultrasound imaging confirms appropriate placement of the needles
within the thyroid nodule.
IMPRESSION: 1. Technically successful ultrasound guided fine needle aspiration
of indeterminate right-sided thyroid nodule (labeled 1).
2. Technically successful ultrasound guided fine needle aspiration
of indeterminate left-sided thyroid nodule (labeled 2).

## 2022-02-21 ENCOUNTER — Ambulatory Visit (INDEPENDENT_AMBULATORY_CARE_PROVIDER_SITE_OTHER): Payer: BC Managed Care – PPO

## 2022-02-21 DIAGNOSIS — Z3689 Encounter for other specified antenatal screening: Secondary | ICD-10-CM

## 2022-02-21 DIAGNOSIS — O0990 Supervision of high risk pregnancy, unspecified, unspecified trimester: Secondary | ICD-10-CM | POA: Insufficient documentation

## 2022-02-21 DIAGNOSIS — Z3480 Encounter for supervision of other normal pregnancy, unspecified trimester: Secondary | ICD-10-CM

## 2022-02-21 NOTE — Initial Assessments (Addendum)
New OB Intake  I connected with  Dana Murphy on 02/21/22 at 10:15 AM EDT by telephone Video Visit and verified that I am speaking with the correct person using two identifiers. Nurse is located at [employer] and pt is located at home.  I discussed the limitations, risks, security and privacy concerns of performing an evaluation and management service by telephone and the availability of in person appointments. I also discussed with the patient that there may be a patient responsible charge related to this service. The patient expressed understanding and agreed to proceed.  I explained I am completing New OB Intake today. We discussed her EDD of 10/22/2022 that is based on LMP of 01/15/2022. Pt is G2/P0010. I reviewed her allergies, medications, Medical/Surgical/OB history, and appropriate screenings. Based on history, this is a/an pregnancy uncomplicated .   Patient Active Problem List   Diagnosis Date Noted   Supervision of other normal pregnancy, antepartum 02/21/2022   Thyroid nodule 02/26/2020   Enlarged thyroid 02/26/2020    Concerns addressed today  Delivery Plans:  Plans to deliver at [facility] - pt is undecided about this.  Adv pt all our providers only deliver at [facility].  Labs Discussed possible labs to be drawn at new OB appointment.  COVID Vaccine Patient has not had COVID vaccine.   Social Determinants of Health Food Insecurity: denies food insecurity Transportation: Patient denies transportation needs.  First visit review I reviewed new OB appt with pt. I explained she will have ob bloodwork and pap smear/pelvic exam if indicated. Explained pt will be seen by Roberto Scales, CNM at first visit; encounter routed to appropriate provider.   Clinical Staff Provider  Office Location  [facility] Dating    Language  English Anatomy US    Flu Vaccine   Genetic Screen  NIPS:   TDaP vaccine    Hgb A1C or  GTT Early : Third trimester :   Covid  declines   LAB RESULTS   Rhogam   Blood Type --/--/A POS Performed at [facility], [address] [place], [place] [address]  [phone])   Feeding Plan  Antibody    Contraception  Rubella    Circumcision  RPR     Pediatrician   HBsAg     Support Person  HIV    Prenatal Classes  Varicella     GBS  (For PCN allergy, check sensitivities)   BTL Consent  Hep C   VBAC Consent  Pap      Hgb Electro      CF      SMA          Cleophas Dunker, [facility] 02/21/2022  12:04 PM

## 2022-03-14 ENCOUNTER — Encounter: Payer: Self-pay | Admitting: Family Medicine

## 2022-03-14 ENCOUNTER — Encounter: Payer: BC Managed Care – PPO | Admitting: Licensed Practical Nurse

## 2022-03-14 ENCOUNTER — Ambulatory Visit (INDEPENDENT_AMBULATORY_CARE_PROVIDER_SITE_OTHER): Payer: BC Managed Care – PPO | Admitting: Family Medicine

## 2022-03-14 ENCOUNTER — Other Ambulatory Visit (HOSPITAL_COMMUNITY)
Admission: RE | Admit: 2022-03-14 | Discharge: 2022-03-14 | Disposition: A | Discharge status: Home / Self Care | Payer: BC Managed Care – PPO | Source: Ambulatory Visit | Attending: Licensed Practical Nurse | Admitting: Licensed Practical Nurse

## 2022-03-14 VITALS — BP 108/80 | Temp 97.8°F | Wt 248.0 lb

## 2022-03-14 DIAGNOSIS — Z833 Family history of diabetes mellitus: Secondary | ICD-10-CM | POA: Insufficient documentation

## 2022-03-14 DIAGNOSIS — E049 Nontoxic goiter, unspecified: Secondary | ICD-10-CM | POA: Diagnosis not present

## 2022-03-14 DIAGNOSIS — O99211 Obesity complicating pregnancy, first trimester: Secondary | ICD-10-CM | POA: Diagnosis not present

## 2022-03-14 DIAGNOSIS — O0990 Supervision of high risk pregnancy, unspecified, unspecified trimester: Secondary | ICD-10-CM

## 2022-03-14 DIAGNOSIS — O99210 Obesity complicating pregnancy, unspecified trimester: Secondary | ICD-10-CM | POA: Insufficient documentation

## 2022-03-14 DIAGNOSIS — Z3480 Encounter for supervision of other normal pregnancy, unspecified trimester: Secondary | ICD-10-CM | POA: Diagnosis not present

## 2022-03-14 DIAGNOSIS — E669 Obesity, unspecified: Secondary | ICD-10-CM | POA: Diagnosis not present

## 2022-03-14 DIAGNOSIS — O99352 Diseases of the nervous system complicating pregnancy, second trimester: Secondary | ICD-10-CM | POA: Diagnosis not present

## 2022-03-14 DIAGNOSIS — O24410 Gestational diabetes mellitus in pregnancy, diet controlled: Secondary | ICD-10-CM | POA: Diagnosis not present

## 2022-03-14 DIAGNOSIS — O24112 Pre-existing diabetes mellitus, type 2, in pregnancy, second trimester: Secondary | ICD-10-CM | POA: Diagnosis not present

## 2022-03-14 DIAGNOSIS — O99212 Obesity complicating pregnancy, second trimester: Secondary | ICD-10-CM | POA: Diagnosis not present

## 2022-03-14 DIAGNOSIS — Z3A38 38 weeks gestation of pregnancy: Secondary | ICD-10-CM | POA: Diagnosis not present

## 2022-03-14 DIAGNOSIS — O35AXX0 Maternal care for other (suspected) fetal abnormality and damage, fetal facial anomalies, not applicable or unspecified: Secondary | ICD-10-CM | POA: Diagnosis not present

## 2022-03-14 DIAGNOSIS — O3503X0 Maternal care for (suspected) central nervous system malformation or damage in fetus, choroid plexus cysts, not applicable or unspecified: Secondary | ICD-10-CM | POA: Diagnosis not present

## 2022-03-14 DIAGNOSIS — Z3A18 18 weeks gestation of pregnancy: Secondary | ICD-10-CM | POA: Diagnosis not present

## 2022-03-14 HISTORY — DX: Family history of diabetes mellitus: Z83.3

## 2022-03-14 LAB — POCT URINALYSIS DIPSTICK OB
Glucose, UA: NEGATIVE
POC,PROTEIN,UA: NEGATIVE

## 2022-03-14 NOTE — Addendum Note (Signed)
Addended by: Quintella Baton D on: 03/14/2022 11:53 AM   Modules accepted: Orders

## 2022-03-14 NOTE — Progress Notes (Signed)
INITIAL PRENATAL VISIT  Subjective:   Dana Murphy is being seen today for her first obstetrical visit.  This is a planned pregnancy. This is a desired pregnancy.  She is at 39w2dgestation by LMP Her obstetrical history is significant for obesity. Relationship with FOB: significant other, living together. Patient does intend to breast feed. Pregnancy history fully reviewed.  Patient reports  nausea, vomited x1 but able to tolerate food. Reports breast tenderness. FOB's parents are adopted and limited history.  .  Indications for ASA therapy (per uptodate) One of the following: Previous pregnancy with preeclampsia, especially early onset and with an adverse outcome No Multifetal gestation No Chronic hypertension No Type 1 or 2 diabetes mellitus No Chronic kidney disease No Autoimmune disease (antiphospholipid syndrome, systemic lupus erythematosus) No  Two or more of the following: Nulliparity Yes Obesity (body mass index >30 kg/m2) Yes Family history of preeclampsia in mother or sister No Age ?35 years No Sociodemographic characteristics (African American race, low socioeconomic level) No Personal risk factors (eg, previous pregnancy with low birth weight or small for gestational age infant, previous adverse pregnancy outcome [eg, stillbirth], interval >10 years between pregnancies) No  Indications for early GDM screening  First-degree relative with diabetes Yes BMI >30kg/m2 Yes Age > 25 No Previous birth of an infant weighing ?4000 g No Gestational diabetes mellitus in a previous pregnancy No Glycated hemoglobin ?5.7 percent (39 mmol/mol), impaired glucose tolerance, or impaired fasting glucose on previous testing No High-risk race/ethnicity (eg, African American, Latino, Native American, [place], Pacific Islander) No Previous stillbirth of unknown cause No Maternal birthweight > 9 lbs No History of cardiovascular disease No Hypertension or on therapy for  hypertension No High-density lipoprotein cholesterol level <35 mg/dL (0.90 mmol/L) and/or a triglyceride level >250 mg/dL (2.82 mmol/L) No Polycystic ovary syndrome No Physical inactivity Yes Other clinical condition associated with insulin resistance (eg, severe obesity, acanthosis nigricans) No Current use of glucocorticoids No   Early screening tests: FBS, A1C, Random CBG, glucose challenge   Review of Systems:  Review of Systems  Constitutional:  Negative for chills and fever.  HENT:  Negative for congestion and sore throat.   Eyes:  Negative for pain.  Respiratory:  Negative for cough and shortness of breath.   Cardiovascular:  Negative for chest pain.  Gastrointestinal:  Positive for nausea. Negative for abdominal pain, diarrhea and vomiting.  Genitourinary:  Negative for dysuria and flank pain.  Musculoskeletal:  Negative for back pain.  Skin:  Negative for rash.  Neurological:  Negative for dizziness.     Objective:    Obstetric History OB History  Gravida Para Term Preterm AB Living  2 0 0 0 1 0  SAB IAB Ectopic Multiple Live Births  0 0 0 0 0    # Outcome Date GA Lbr Len/2nd Weight Sex Delivery Anes PTL Lv  2 Current           1 AB 07/21/21 174w1d  SAB       Past Medical History:  Diagnosis Date   No pertinent past medical history     Past Surgical History:  Procedure Laterality Date   NO PAST SURGERIES      Current Outpatient Medications on File Prior to Visit  Medication Sig Dispense Refill   Prenatal Multivit-Min-Fe-FA (PRE-NATAL PO) Take by mouth.     No current facility-administered medications on file prior to visit.    No Known Allergies  Social History:  reports  that she has been smoking cigarettes. She has never used smokeless tobacco. She reports that she does not drink alcohol and does not use drugs.  Family History  Problem Relation Age of Onset   68 / [place] Mother        had two second trimester losses (> 20wk)    Diabetes Mother    Hypertension Mother    Diabetes Father    Hypertension Father    Cancer Neg Hx    Thyroid disease Neg Hx    Hyperlipidemia Neg Hx     The following portions of the patient's history were reviewed and updated as appropriate: allergies, current medications, past family history, past medical history, past social history, past surgical history and problem list.   Physical Exam:  BP 108/80   Temp 97.8 F (36.6 C)   Wt 248 lb (112.5 kg)   LMP 01/15/2022 (Exact Date)   BMI 36.62 kg/m  CONSTITUTIONAL: Well-developed, well-nourished female in no acute distress.  HENT:  Normocephalic, atraumatic, External right and left ear normal. Oropharynx is clear and moist EYES: Conjunctivae normal. No scleral icterus.  NECK: Normal range of motion, supple, no masses.  Normal thyroid.  SKIN: Skin is warm and dry. No rash noted. Not diaphoretic. No erythema. No pallor. MUSCULOSKELETAL: Normal range of motion. No tenderness.  No cyanosis, clubbing, or edema.   NEUROLOGIC: Alert and oriented to person, place, and time. Normal muscle tone coordination.  PSYCHIATRIC: Normal mood and affect. Normal behavior. Normal judgment and thought content. CARDIOVASCULAR: Normal heart rate noted, regular rhythm RESPIRATORY: Clear to auscultation bilaterally. Effort and breath sounds normal, no problems with respiration noted. BREASTS: Symmetric in size. No masses, skin changes, nipple drainage, or lymphadenopathy. ABDOMEN: Soft, normal bowel sounds, no distention noted.  No tenderness, rebound or guarding. Fundal ht: below pelvis PELVIC: not indicated FHR: no performed due to GA and BMI   Assessment:    Pregnancy: G2P0010   1. Supervision of other normal pregnancy, antepartum - Prenatal Panel - TSH + free T4 - Hemoglobin A1c - Hgb Fractionation Cascade - Cervicovaginal ancillary only - [place] MFM OB DETAIL +14 WK; Future - US OB Comp Less 14 Wks; Future - POC Urinalysis Dipstick OB  2.  Enlarged thyroid TSH today  3. Supervision of high risk pregnancy, antepartum Updated box  4. Obesity affecting pregnancy in first trimester Recommended 11-15 lb weight gain with focus on high protein diet  5. Family history of diabetes mellitus (DM) Mother and father have T2DM    Plan:     Initial labs drawn. Prenatal vitamins. Problem list reviewed and updated. Reviewed in detail the nature of the practice with collaborative care between  Genetic screening discussed: NIPS/AFP requested.-- will get NIPS at next visit given early GA today Role of ultrasound in pregnancy discussed; Anatomy US: ordered. Amniocentesis discussed: not indicated. Follow up in 4 weeks. Discussed clinic routines, schedule of care and testing, genetic screening options, involvement of students and residents under the direct supervision of APPs and doctors and presence of female providers. Pt verbalized understanding.   Caren Macadam, MD 03/14/2022 10:33 AM

## 2022-03-15 LAB — CERVICOVAGINAL ANCILLARY ONLY
Chlamydia: NEGATIVE
Comment: NEGATIVE
Comment: NORMAL
Neisseria Gonorrhea: NEGATIVE

## 2022-03-16 LAB — RPR+RH+ABO+RUB AB+AB SCR+CB...
Antibody Screen: NEGATIVE
HIV Screen 4th Generation wRfx: NONREACTIVE
Hematocrit: 40.7 % (ref 34.0–46.6)
Hemoglobin: 13.7 g/dL (ref 11.1–15.9)
Hepatitis B Surface Ag: NEGATIVE
MCH: 29.7 pg (ref 26.6–33.0)
MCHC: 33.7 g/dL (ref 31.5–35.7)
MCV: 88 fL (ref 79–97)
Platelets: 288 10*3/uL (ref 150–450)
RBC: 4.62 x10E6/uL (ref 3.77–5.28)
RDW: 11.9 % (ref 11.7–15.4)
RPR Ser Ql: NONREACTIVE
Rh Factor: POSITIVE
Rubella Antibodies, IGG: 2.19 index (ref >0.99)
Varicella zoster IgG: <135 index — ABNORMAL LOW (ref >165)
WBC: 10.3 10*3/uL (ref 3.4–10.8)

## 2022-03-16 LAB — HGB FRACTIONATION CASCADE
Hgb A2: 2.8 % (ref 1.8–3.2)
Hgb A: 97.2 % (ref 96.4–98.8)
Hgb F: 0 % (ref 0.0–2.0)
Hgb S: 0 %

## 2022-03-16 LAB — HEMOGLOBIN A1C
Est. average glucose Bld gHb Est-mCnc: 103 mg/dL
Hgb A1c MFr Bld: 5.2 % (ref 4.8–5.6)

## 2022-03-16 LAB — TSH+FREE T4
Free T4: 1.06 ng/dL (ref 0.82–1.77)
TSH: 1.69 u[IU]/mL (ref 0.450–4.500)

## 2022-03-17 ENCOUNTER — Ambulatory Visit (INDEPENDENT_AMBULATORY_CARE_PROVIDER_SITE_OTHER): Payer: BC Managed Care – PPO

## 2022-03-17 DIAGNOSIS — Z3480 Encounter for supervision of other normal pregnancy, unspecified trimester: Secondary | ICD-10-CM | POA: Diagnosis not present

## 2022-03-20 LAB — CULTURE, URINE COMPREHENSIVE

## 2022-03-21 ENCOUNTER — Encounter: Payer: Self-pay | Admitting: Family Medicine

## 2022-03-21 DIAGNOSIS — O0990 Supervision of high risk pregnancy, unspecified, unspecified trimester: Secondary | ICD-10-CM

## 2022-04-11 ENCOUNTER — Encounter: Payer: Self-pay | Admitting: Obstetrics

## 2022-04-11 ENCOUNTER — Ambulatory Visit (INDEPENDENT_AMBULATORY_CARE_PROVIDER_SITE_OTHER): Payer: BC Managed Care – PPO | Admitting: Obstetrics

## 2022-04-11 VITALS — BP 120/80 | Wt 248.0 lb

## 2022-04-11 DIAGNOSIS — Z3A10 10 weeks gestation of pregnancy: Secondary | ICD-10-CM

## 2022-04-11 DIAGNOSIS — Z3480 Encounter for supervision of other normal pregnancy, unspecified trimester: Secondary | ICD-10-CM

## 2022-04-11 NOTE — Progress Notes (Signed)
Routine Prenatal Care Visit  Subjective  Dana Murphy is a 21 y.o. G2P0010 at 56w5dbeing seen today for ongoing prenatal care.  She is currently monitored for the following issues for this low-risk pregnancy and has Thyroid nodule; Enlarged thyroid; Supervision of high risk pregnancy, antepartum; Obesity affecting pregnancy; and Family history of diabetes mellitus (DM) on their problem list.  ----------------------------------------------------------------------------------- Patient reports no complaints.    . Vag. Bleeding: None.   . Leaking Fluid denies.  ----------------------------------------------------------------------------------- The following portions of the patient's history were reviewed and updated as appropriate: allergies, current medications, past family history, past medical history, past social history, past surgical history and problem list. Problem list updated.  Objective  Blood pressure 120/80, weight 248 lb (112.5 kg), last menstrual period 01/15/2022, unknown if currently breastfeeding. Pregravid weight 248 lb (112.5 kg) Total Weight Gain 0 lb (0 kg) Urinalysis: Urine Protein    Urine Glucose    Fetal Status:           General:  Alert, oriented and cooperative. Patient is in no acute distress.  Skin: Skin is warm and dry. No rash noted.   Cardiovascular: Normal heart rate noted  Respiratory: Normal respiratory effort, no problems with respiration noted  Abdomen: Soft, gravid, appropriate for gestational age. Pain/Pressure: Absent     Pelvic:  Cervical exam deferred        Extremities: Normal range of motion.     Mental Status: Normal mood and affect. Normal behavior. Normal judgment and thought content.   Assessment   21y.o. G2P0010 at 158w5dy  11/02/2022, by Ultrasound presenting for routine prenatal visit  Plan   SECOND Problems (from 02/21/22 to present)    Problem Noted Resolved   Supervision of high risk pregnancy, antepartum 02/21/2022 by JoCleophas DunkerCMA No   Overview Addendum 03/21/2022  9:32 AM by NeCaren MacadamMD     Nursing Staff Provider  Office Location  [facility] Dating  LMP/8w51w2d    Language   Anatomy US [place] Flu Vaccine   Genetic/Carrier Screen  NIPS:    AFP:    Horizon:  TDaP Vaccine    Hgb A1C or  GTT Early  Third trimester   COVID Vaccine    LAB RESULTS   Rhogam   Blood Type --/--/A POS [facility] [id number])   Baby Feeding Plan  breast Antibody    Contraception  Rubella    Circumcision  RPR     Pediatrician   HBsAg     Support Person  FOB HCVAb   Prenatal Classes  HIV       BTL Consent  GBS   (For PCN allergy, check sensitivities)   VBAC Consent  Pap        DME Rx '[ ]'$  BP cuff '[ ]'$  Weight Scale Waterbirth  '[ ]'$  Class '[ ]'$  Consent '[ ]'$  CNM visit  PHQ9 & GAD7 [  ] new OB [  ] 28 weeks  [  ] 36 weeks Induction  '[ ]'$  Orders Entered '[ ]'$ Foley Y/N             Preterm labor symptoms and general obstetric precautions including but not limited to vaginal bleeding, contractions, leaking of fluid and fetal movement were reviewed in detail with the patient. Please refer to After Visit Summary for other counseling recommendations.  We were able to hear FHTS  today! No follow-ups on file.  Imagene Riches, CNM  04/11/2022 3:47 PM

## 2022-05-10 ENCOUNTER — Ambulatory Visit (INDEPENDENT_AMBULATORY_CARE_PROVIDER_SITE_OTHER): Payer: BC Managed Care – PPO | Admitting: Obstetrics

## 2022-05-10 VITALS — BP 122/70 | Wt 248.0 lb

## 2022-05-10 DIAGNOSIS — O0990 Supervision of high risk pregnancy, unspecified, unspecified trimester: Secondary | ICD-10-CM

## 2022-05-10 DIAGNOSIS — Z3480 Encounter for supervision of other normal pregnancy, unspecified trimester: Secondary | ICD-10-CM

## 2022-05-10 NOTE — Progress Notes (Signed)
No vb. No lof.  

## 2022-05-10 NOTE — Progress Notes (Signed)
Routine Prenatal Care Visit  Subjective  Dana Murphy is a 21 y.o. G2P0010 at 2w6dbeing seen today for ongoing prenatal care.  She is currently monitored for the following issues for this high-risk pregnancy and has Thyroid nodule; Enlarged thyroid; Supervision of high risk pregnancy, antepartum; Obesity affecting pregnancy; and Family history of diabetes mellitus (DM) on their problem list.  ----------------------------------------------------------------------------------- Patient reports no complaints.    . Vag. Bleeding: None.   . Leaking Fluid denies.  ----------------------------------------------------------------------------------- The following portions of the patient's history were reviewed and updated as appropriate: allergies, current medications, past family history, past medical history, past social history, past surgical history and problem list. Problem list updated.  Objective  Blood pressure 122/70, weight 248 lb (112.5 kg), last menstrual period 01/15/2022, unknown if currently breastfeeding. Pregravid weight 248 lb (112.5 kg) Total Weight Gain 0 lb (0 kg) Urinalysis: Urine Protein    Urine Glucose    Fetal Status:           General:  Alert, oriented and cooperative. Patient is in no acute distress.  Skin: Skin is warm and dry. No rash noted.   Cardiovascular: Normal heart rate noted  Respiratory: Normal respiratory effort, no problems with respiration noted  Abdomen: Soft, gravid, appropriate for gestational age. Pain/Pressure: Absent     Pelvic:  Cervical exam deferred        Extremities: Normal range of motion.     Mental Status: Normal mood and affect. Normal behavior. Normal judgment and thought content.   Assessment   21y.o. G2P0010 at 169w6dy  11/02/2022, by Ultrasound presenting for routine prenatal visit  Plan   SECOND Problems (from 02/21/22 to present)    Problem Noted Resolved   Supervision of high risk pregnancy, antepartum 02/21/2022 by JoCleophas DunkerCMA No   Overview Addendum 05/10/2022  4:21 PM by FrImagene RichesCNM     Nursing Staff Provider  Office Location  [facility] Dating  LMP/8w74w2d    Language   Anatomy US [place] Flu Vaccine   Genetic/Carrier Screen  NIPS:    AFP:    Horizon:  TDaP Vaccine    Hgb A1C or  GTT Early  Third trimester   COVID Vaccine    LAB RESULTS   Rhogam   Blood Type A/Positive/-- (06/12 1033)   Baby Feeding Plan  breast Antibody Negative (06/12 1033)  Contraception  Rubella 2.19 (06/12 1033)  Circumcision  RPR Non Reactive (06/12 1033)   Pediatrician   HBsAg Negative (06/12 1033)   Support Person  FOB HCVAb   Prenatal Classes  HIV Non Reactive (06/12 1033)     BTL Consent  GBS   (For PCN allergy, check sensitivities)   VBAC Consent  Pap        DME Rx '[ ]'$  BP cuff '[ ]'$  Weight Scale Waterbirth  '[ ]'$  Class '[ ]'$  Consent '[ ]'$  CNM visit  PHQ9 & GAD7 [  ] new OB [  ] 28 weeks  [  ] 36 weeks Induction  '[ ]'$  Orders Entered '[ ]'$ Foley Y/N             Preterm labor symptoms and general obstetric precautions including but not limited to vaginal bleeding, contractions, leaking of fluid and fetal movement were reviewed in detail with the patient. Please refer to After Visit Summary for other counseling recommendations.  Her anatomy scan has been scheduled. Encouraged her to read about labor and  Birth.   Return in about 4 weeks (around 06/07/2022) for return OB.  Imagene Riches, CNM  05/10/2022 4:38 PM

## 2022-05-26 ENCOUNTER — Ambulatory Visit: Payer: BC Managed Care – PPO

## 2022-06-02 ENCOUNTER — Ambulatory Visit: Payer: BC Managed Care – PPO | Attending: Maternal & Fetal Medicine

## 2022-06-02 ENCOUNTER — Ambulatory Visit: Payer: Self-pay

## 2022-06-02 ENCOUNTER — Ambulatory Visit (HOSPITAL_BASED_OUTPATIENT_CLINIC_OR_DEPARTMENT_OTHER): Payer: BC Managed Care – PPO

## 2022-06-02 ENCOUNTER — Ambulatory Visit: Payer: Self-pay | Admitting: Maternal & Fetal Medicine

## 2022-06-02 ENCOUNTER — Other Ambulatory Visit: Payer: Self-pay

## 2022-06-02 VITALS — BP 128/75 | HR 84 | Temp 98.4°F | Ht 69.0 in | Wt 246.5 lb

## 2022-06-02 DIAGNOSIS — Z3A18 18 weeks gestation of pregnancy: Secondary | ICD-10-CM | POA: Diagnosis not present

## 2022-06-02 DIAGNOSIS — E669 Obesity, unspecified: Secondary | ICD-10-CM

## 2022-06-02 DIAGNOSIS — O35AXX0 Maternal care for other (suspected) fetal abnormality and damage, fetal facial anomalies, not applicable or unspecified: Secondary | ICD-10-CM | POA: Diagnosis not present

## 2022-06-02 DIAGNOSIS — O3503X0 Maternal care for (suspected) central nervous system malformation or damage in fetus, choroid plexus cysts, not applicable or unspecified: Secondary | ICD-10-CM

## 2022-06-02 DIAGNOSIS — O99352 Diseases of the nervous system complicating pregnancy, second trimester: Secondary | ICD-10-CM | POA: Diagnosis not present

## 2022-06-02 DIAGNOSIS — O99212 Obesity complicating pregnancy, second trimester: Secondary | ICD-10-CM | POA: Insufficient documentation

## 2022-06-02 DIAGNOSIS — O24112 Pre-existing diabetes mellitus, type 2, in pregnancy, second trimester: Secondary | ICD-10-CM | POA: Diagnosis not present

## 2022-06-02 DIAGNOSIS — Z3480 Encounter for supervision of other normal pregnancy, unspecified trimester: Secondary | ICD-10-CM

## 2022-06-02 DIAGNOSIS — O0990 Supervision of high risk pregnancy, unspecified, unspecified trimester: Secondary | ICD-10-CM

## 2022-06-02 NOTE — Progress Notes (Addendum)
Referring provider: Dr. 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 Within the limits of the procedure, all other fetal anatomy was seen and appeared normal. See the ultrasound report for further details.  We discussed with the patient that the choroid plexus is an area in the brain where cerebral spinal fluid, the fluid that bathes the brain and spinal cord, is made. Cysts, or fluid filled sacs, are sometimes found in the choroid plexus of babies both before and after they are born. These cysts are referred to as choroid plexus cysts (CPC).  Some literature suggests that as many as 1 in 50 fetuses (2%) evaluated by ultrasound will show these cysts and it is believed that most are a normal variation of development. Numerous studies have been performed to determine the significance of this finding and have shown CPCs are an ultrasound finding in approximately 30-50% of fetuses with trisomy 34, but are an isolated finding in less than 10% of fetuses with trisomy 58.  This means that if multiple anomalies or markers are seen on ultrasound, the risk for Trisomy 18 is further increased.  The increase in risk is less significant if the choroid plexus cysts are an "isolated" finding on ultrasound.  In the absence of a chromosome condition, choroid plexus cysts are not expected to be harmful to the fetus.    In addition, the nasal bone appeared to be hypoplastic or absent at the time of the patient's ultrasound.  Studies have suggested that an absent or hypoplastic nasal bone is associated with an increased risk of a chromosome abnormality, the most common of which is Down Syndrome.  It is estimated that approximately 1% of babies with normal chromosomes will have an absent nasal bone on second trimester  ultrasound, but this number varies somewhat among various ethnic groups, appearing more common in women of Afro-Caribbean ancestry and Asian ancestry. In the second trimester, the nasal bone is absent in approximately 37% of fetuses with Down Syndrome.  The likelihood ratio is estimated to be 41.  The concern for a chromosome condition is further increased when other ultrasound makers are observed.  We cannot exclude other genetic syndromes that can be associated with midface hypoplasia.   Trisomy 78 and Down syndrome both occur when a baby has an extra chromosome.  To review, chromosomes are the inherited structures that contain our genes (traits).  Each cell of our body normally has 46 chromosomes, matched up in 23 pairs.  The last pair determines our gender and are called the sex chromosomes.  A female has an X and a Y chromosome, while a female has two X chromosomes.  Rarely, when a mother's egg and father's sperm unite, an extra or missing chromosome can be passed on to the baby by mistake.  When a baby has a change in the number or structure of the chromosomes, there are physical as well as intellectual differences which occur.  In the case of Trisomy 18, these differences are severe and often result in a very shortened lifespan. Features of Down syndrome my include developmental delays, heart defects or other birth differences and distinctive facial features.  We discussed the following prenatal testing options for this pregnancy: Amniocentesis involves the removal of a small amount of amniotic fluid from the sac surrounding the  fetus with the use of a thin needle inserted through the maternal abdomen and uterus.  Ultrasound guidance is used throughout the procedure.  Fetal cells from amniotic fluid are directly evaluated and > 99.5% of chromosome problems and > 98% of open neural tube defects can be detected. This procedure is generally performed after the 15th week of pregnancy.  The main risks to this  procedure include complications leading to miscarriage in less than 1 in 200 cases (0.5%).  We also reviewed the availability of cell free fetal DNA testing from maternal blood to determine whether or not the baby may have Down syndrome, trisomy 49, or trisomy 76.  This test utilizes a maternal blood sample and DNA sequencing technology to isolate circulating cell free fetal DNA from maternal plasma.  The fetal DNA can then be analyzed for DNA sequences that are derived from the three most common chromosomes involved in aneuploidy, chromosomes 13, 18, and 21.  If the overall amount of DNA is greater than the expected level for any of these chromosomes, aneuploidy is suspected.  While we do not consider it a replacement for invasive testing and karyotype analysis, a negative result from this testing would be reassuring, though not a guarantee of a normal chromosome complement for the baby.  An abnormal result is certainly suggestive of an abnormal chromosome complement, though we would still recommend CVS or amniocentesis to confirm any findings from this testing.  Cystic Fibrosis and Spinal Muscular Atrophy (SMA) screening were also discussed with the patient. Both conditions are recessive, which means that both parents must be carriers in order to have a child with the disease.  Cystic fibrosis (CF) is one of the most common genetic conditions in persons of Caucasian ancestry.  This condition occurs in approximately 1 in 2,500 Caucasian persons and results in thickened secretions in the lungs, digestive, and reproductive systems.  For a baby to be at risk for having CF, both of the parents must be carriers for this condition.  Approximately 1 in 14 Caucasian persons is a carrier for CF.  Current carrier testing looks for the most common mutations in the gene for CF and can detect approximately 90% of carriers in the Caucasian population.  This means that the carrier screening can greatly reduce, but cannot  eliminate, the chance for an individual to have a child with CF.  If an individual is found to be a carrier for CF, then carrier testing would be available for the partner. As part of [place] newborn screening profile, all babies born in the state of [place] will have a two-tier screening process.  Specimens are first tested to determine the concentration of immunoreactive trypsinogen (IRT).  The top 5% of specimens with the highest IRT values then undergo DNA testing using a panel of over 40 common CF mutations. SMA is a neurodegenerative disorder that leads to atrophy of skeletal muscle and overall weakness.  This condition is also more prevalent in the Caucasian population, with 1 in 40-1 in 60 persons being a carrier and 1 in 6,000-1 in 10,000 children being affected.  There are multiple forms of the disease, with some causing death in infancy to other forms with survival into adulthood.  The genetics of SMA is complex, but carrier screening can detect up to 95% of carriers in the Caucasian population.  Similar to CF, a negative result can greatly reduce, but cannot eliminate, the chance to have a child with SMA. Prior testing for hemoglobinopathies was  normal (AA, MCV 88).  We inquired about the pregnancy and family history.  DanaTschetter reported no complications or exposures in this pregnancy.  She also stated that she has one sister with developmental delays of unknown cause. This sister is 8 years old and lives at home,  she is working on getting her drivers license and can take care of all of her daily activities.  No genetic testing has been performed that the patient is aware of.  We reviewed that there may be many causes for developmental differences including inherited conditions, birth trauma, illnesses and other causes.  Without a known diagnosis, the recurrence risk for other family members is difficult to determine.The father of the baby stated that he is in good health, but that both  of his parents are adopted and therefore have no other family history information.  We remainder of the family history was unremarkable for birth defects, recurrent pregnancy loss, developmental delays or known genetic conditions.  Plan of care: After consideration of the options, the patient elected to return to our clinic on Tuesday, 06/07/22, for a lab only visit to have [place] with SCA testing drawn.  She had not eaten today and preferred to wait for testing until next week. She declined amniocentesis at this time. The couple declined carrier screening for CF and SMA. The patient was scheduled to return to clinic for a follow up ultrasound per MFM recommendations.  Thank you for involving [place] in the care of this patient.  Ms. Scripter was encouraged to call with questions or concerns. We can be reached at [phone].   Wilburt Finlay, MS, CGC

## 2022-06-03 ENCOUNTER — Encounter: Payer: Self-pay | Admitting: Family Medicine

## 2022-06-03 DIAGNOSIS — O35BXX0 Maternal care for other (suspected) fetal abnormality and damage, fetal cardiac anomalies, not applicable or unspecified: Secondary | ICD-10-CM | POA: Insufficient documentation

## 2022-06-03 DIAGNOSIS — O283 Abnormal ultrasonic finding on antenatal screening of mother: Secondary | ICD-10-CM | POA: Insufficient documentation

## 2022-06-03 DIAGNOSIS — G930 Cerebral cysts: Secondary | ICD-10-CM | POA: Insufficient documentation

## 2022-06-07 ENCOUNTER — Ambulatory Visit (INDEPENDENT_AMBULATORY_CARE_PROVIDER_SITE_OTHER): Payer: BC Managed Care – PPO | Admitting: Obstetrics

## 2022-06-07 ENCOUNTER — Ambulatory Visit: Payer: Self-pay

## 2022-06-07 ENCOUNTER — Other Ambulatory Visit
Admission: RE | Admit: 2022-06-07 | Discharge: 2022-06-07 | Disposition: A | Discharge status: Home / Self Care | Payer: Self-pay | Source: Ambulatory Visit | Attending: Obstetrics and Gynecology | Admitting: Obstetrics and Gynecology

## 2022-06-07 ENCOUNTER — Other Ambulatory Visit: Payer: Self-pay

## 2022-06-07 VITALS — BP 122/80 | Wt 248.2 lb

## 2022-06-07 DIAGNOSIS — Z3A18 18 weeks gestation of pregnancy: Secondary | ICD-10-CM

## 2022-06-07 DIAGNOSIS — O99212 Obesity complicating pregnancy, second trimester: Secondary | ICD-10-CM

## 2022-06-07 DIAGNOSIS — O0990 Supervision of high risk pregnancy, unspecified, unspecified trimester: Secondary | ICD-10-CM

## 2022-06-07 DIAGNOSIS — Z3480 Encounter for supervision of other normal pregnancy, unspecified trimester: Secondary | ICD-10-CM

## 2022-06-07 NOTE — Progress Notes (Signed)
Routine Prenatal Care Visit  Subjective  Dana Murphy is a 21 y.o. G2P0010 at 79w6dbeing seen today for ongoing prenatal care.  She is currently monitored for the following issues for this high-risk pregnancy and has Thyroid nodule; Enlarged thyroid; Supervision of high risk pregnancy, antepartum; Obesity affecting pregnancy; Family history of diabetes mellitus (DM); Choroid plexus cyst; and Fetal cardiac anomaly complicating pregnancy, antepartum on their problem list.  ----------------------------------------------------------------------------------- Patient reports no complaints.  She has had some anxiety related to her anatomy scan that indicates a hypoplastic nasal bone. She has chosen to proceed with MaternT testing today. She knows she is having a girl, and has named her Dana Murphy or  " Dana Murphy" Contractions: Not present. Vag. Bleeding: None.  Movement: Present. Leaking Fluid denies.  ----------------------------------------------------------------------------------- The following portions of the patient's history were reviewed and updated as appropriate: allergies, current medications, past family history, past medical history, past social history, past surgical history and problem list. Problem list updated.  Objective  Blood pressure 122/80, weight 248 lb 3.2 oz (112.6 kg), last menstrual period 01/15/2022, unknown if currently breastfeeding. Pregravid weight 248 lb (112.5 kg) Total Weight Gain 3.2 oz (0.091 kg) Urinalysis: Urine Protein    Urine Glucose    Fetal Status:     Movement: Present     General:  Alert, oriented and cooperative. Patient is in no acute distress.  Skin: Skin is warm and dry. No rash noted.   Cardiovascular: Normal heart rate noted  Respiratory: Normal respiratory effort, no problems with respiration noted  Abdomen: Soft, gravid, appropriate for gestational age. Pain/Pressure: Absent     Pelvic:  Cervical exam deferred        Extremities: Normal  range of motion.     Mental Status: Normal mood and affect. Normal behavior. Normal judgment and thought content.   Assessment   21y.o. 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      Supervision of high risk pregnancy, antepartum 02/21/2022 by JoCleophas DunkerCMA No   Overview Addendum 06/07/2022  4:21 PM by FrImagene RichesCNM     Nursing Staff Provider  Office Location  [facility] Dating  LMP/8w55w2d    Language   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, hypoplastic nasal bone  Flu Vaccine   Genetic/Carrier Screen  NIPS: drawn 06/07/22   AFP:    Horizon:  TDaP Vaccine    Hgb A1C or  GTT Early  Third trimester   COVID Vaccine    LAB RESULTS   Rhogam   Blood Type A/Positive/-- (06/12 1033)   Baby Feeding Plan  breast Antibody Negative (06/12 1033)  Contraception  Rubella 2.19 (06/12 1033)  Circumcision  RPR Non Reactive (06/12 1033)   Pediatrician   HBsAg Negative (06/12 1033)   Support Person  FOB HCVAb   Prenatal Classes  HIV Non Reactive (06/12 1033)     BTL Consent  GBS   (For PCN allergy, check sensitivities)   VBAC Consent  Pap        DME Rx '[ ]'$  BP  cuff '[ ]'$  Weight Scale Waterbirth  '[ ]'$  Class '[ ]'$  Consent '[ ]'$  CNM visit  PHQ9 & GAD7 [  ] new OB [  ] 28 weeks  [  ] 36 weeks Induction  '[ ]'$  Orders Entered '[ ]'$ Foley Y/N             Preterm labor symptoms and general obstetric precautions including but not limited to vaginal bleeding, contractions, leaking of fluid and fetal movement were reviewed in detail with the patient. Please refer to After Visit Summary for other counseling  recommendations.  We will look ou t for the results of her genetic screening lab.  Return in about 4 weeks (around 07/05/2022) for return OB.  Imagene Riches, CNM  06/07/2022 4:22 PM

## 2022-06-12 LAB — MATERNIT21 PLUS CORE+SCA
Fetal Fraction: 5
Monosomy X (Turner Syndrome): NOT DETECTED
Result (T21): NEGATIVE
Trisomy 13 (Patau syndrome): NEGATIVE
Trisomy 18 (Edwards syndrome): NEGATIVE
Trisomy 21 (Down syndrome): NEGATIVE
XXX (Triple X Syndrome): NOT DETECTED
XXY (Klinefelter Syndrome): NOT DETECTED
XYY (Jacobs Syndrome): NOT DETECTED

## 2022-06-16 ENCOUNTER — Telehealth: Payer: Self-pay | Admitting: Genetics

## 2022-06-16 NOTE — Telephone Encounter (Signed)
Fenna was contacted by telephone to review their cell-free DNA screening (cfDNA) result. The result is low risk. This screening significantly reduces the risk that the current pregnancy has Down syndrome, Trisomy 33, Trisomy 28, and common sex chromosome aneuploidies. We reviewed that this is a screening and not a diagnostic test. All questions answered.

## 2022-07-04 ENCOUNTER — Ambulatory Visit (INDEPENDENT_AMBULATORY_CARE_PROVIDER_SITE_OTHER): Payer: BC Managed Care – PPO | Admitting: Obstetrics

## 2022-07-04 VITALS — BP 120/76 | Wt 249.0 lb

## 2022-07-04 DIAGNOSIS — E669 Obesity, unspecified: Secondary | ICD-10-CM

## 2022-07-04 DIAGNOSIS — O09292 Supervision of pregnancy with other poor reproductive or obstetric history, second trimester: Secondary | ICD-10-CM

## 2022-07-04 DIAGNOSIS — O35BXX0 Maternal care for other (suspected) fetal abnormality and damage, fetal cardiac anomalies, not applicable or unspecified: Secondary | ICD-10-CM

## 2022-07-04 DIAGNOSIS — O99212 Obesity complicating pregnancy, second trimester: Secondary | ICD-10-CM

## 2022-07-04 DIAGNOSIS — E041 Nontoxic single thyroid nodule: Secondary | ICD-10-CM

## 2022-07-04 DIAGNOSIS — Z3A22 22 weeks gestation of pregnancy: Secondary | ICD-10-CM

## 2022-07-04 DIAGNOSIS — O99282 Endocrine, nutritional and metabolic diseases complicating pregnancy, second trimester: Secondary | ICD-10-CM

## 2022-07-04 DIAGNOSIS — Z3480 Encounter for supervision of other normal pregnancy, unspecified trimester: Secondary | ICD-10-CM

## 2022-07-04 LAB — POCT URINALYSIS DIPSTICK
Glucose, UA: NEGATIVE
Protein, UA: NEGATIVE

## 2022-07-04 NOTE — Progress Notes (Signed)
No vb. No lof.  

## 2022-07-04 NOTE — Progress Notes (Signed)
Routine Prenatal Care Visit  Subjective  Dana Murphy is a 21 y.o. G2P0010 at 2w5dbeing seen today for ongoing prenatal care.  She is currently monitored for the following issues for this high-risk pregnancy and has Thyroid nodule; Enlarged thyroid; Supervision of high risk pregnancy, antepartum; Obesity affecting pregnancy; Family history of diabetes mellitus (DM); Choroid plexus cyst; and Fetal cardiac anomaly complicating pregnancy, antepartum on their problem list.  ----------------------------------------------------------------------------------- Patient reports no complaints.  She does share that historically she has been able to adjust her right hip- that it "pops". She asked whether this might affect her pregnancy or delivery. She is not struggling with this now. Contractions: Not present. Vag. Bleeding: None.  Movement: Present. Leaking Fluid denies.  ----------------------------------------------------------------------------------- The following portions of the patient's history were reviewed and updated as appropriate: allergies, current medications, past family history, past medical history, past social history, past surgical history and problem list. Problem list updated.  Objective  Blood pressure 120/76, weight 112.9 kg, last menstrual period 01/15/2022, unknown if currently breastfeeding. Pregravid weight 112.5 kg Total Weight Gain 0.454 kg Urinalysis: Urine Protein    Urine Glucose    Fetal Status:     Movement: Present     General:  Alert, oriented and cooperative. Patient is in no acute distress.  Skin: Skin is warm and dry. No rash noted.   Cardiovascular: Normal heart rate noted  Respiratory: Normal respiratory effort, no problems with respiration noted  Abdomen: Soft, gravid, appropriate for gestational age. Pain/Pressure: Absent     Pelvic:  Cervical exam deferred        Extremities: Normal range of motion.     Mental Status: Normal mood and affect. Normal  behavior. Normal judgment and thought content.   Assessment   21y.o. 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      Supervision of high risk pregnancy, antepartum 02/21/2022 by JoCleophas DunkerCMA No   Overview Addendum 06/07/2022  4:21 PM by FrImagene RichesCNM     Nursing Staff Provider  Office Location  [facility] Dating  LMP/8w18w2d    Language   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, hypoplastic nasal bone  Flu Vaccine   Genetic/Carrier Screen  NIPS: drawn 06/07/22   AFP:    Horizon:  TDaP Vaccine    Hgb A1C or  GTT Early  Third trimester   COVID Vaccine    LAB RESULTS   Rhogam   Blood Type A/Positive/-- (06/12 1033)   Baby Feeding Plan  breast Antibody Negative (06/12 1033)  Contraception  Rubella 2.19 (06/12 1033)  Circumcision  RPR Non Reactive (06/12 1033)   Pediatrician   HBsAg Negative (06/12 1033)   Support Person  FOB HCVAb   Prenatal Classes  HIV Non Reactive (06/12 1033)     BTL Consent  GBS   (For PCN allergy, check sensitivities)   VBAC Consent  Pap        DME Rx '[ ]'$  BP cuff '[ ]'$  Weight Scale Waterbirth  '[ ]'$  Class '[ ]'$  Consent '[ ]'$  CNM  visit  PHQ9 & GAD7 [  ] new OB [  ] 28 weeks  [  ] 36 weeks Induction  '[ ]'$  Orders Entered '[ ]'$ Foley Y/N             Preterm labor symptoms and general obstetric precautions including but not limited to vaginal bleeding, contractions, leaking of fluid and fetal movement were reviewed in detail with the patient. Please refer to After Visit Summary for other counseling recommendations.   Return in about 4 weeks (around 08/01/2022) for return  OB.  Imagene Riches, CNM  07/04/2022 4:04 PM

## 2022-08-01 ENCOUNTER — Encounter: Payer: Self-pay | Admitting: Advanced Practice Midwife

## 2022-08-01 ENCOUNTER — Ambulatory Visit (INDEPENDENT_AMBULATORY_CARE_PROVIDER_SITE_OTHER): Payer: BC Managed Care – PPO | Admitting: Advanced Practice Midwife

## 2022-08-01 VITALS — BP 119/68 | Wt 251.0 lb

## 2022-08-01 DIAGNOSIS — O3503X0 Maternal care for (suspected) central nervous system malformation or damage in fetus, choroid plexus cysts, not applicable or unspecified: Secondary | ICD-10-CM

## 2022-08-01 DIAGNOSIS — O99212 Obesity complicating pregnancy, second trimester: Secondary | ICD-10-CM

## 2022-08-01 DIAGNOSIS — O35BXX0 Maternal care for other (suspected) fetal abnormality and damage, fetal cardiac anomalies, not applicable or unspecified: Secondary | ICD-10-CM

## 2022-08-01 DIAGNOSIS — Z130 Encounter for screening for diseases of the blood and blood-forming organs and certain disorders involving the immune mechanism: Secondary | ICD-10-CM

## 2022-08-01 DIAGNOSIS — Z131 Encounter for screening for diabetes mellitus: Secondary | ICD-10-CM

## 2022-08-01 DIAGNOSIS — Z369 Encounter for antenatal screening, unspecified: Secondary | ICD-10-CM

## 2022-08-01 DIAGNOSIS — E669 Obesity, unspecified: Secondary | ICD-10-CM

## 2022-08-01 DIAGNOSIS — Z3A26 26 weeks gestation of pregnancy: Secondary | ICD-10-CM

## 2022-08-01 DIAGNOSIS — O0990 Supervision of high risk pregnancy, unspecified, unspecified trimester: Secondary | ICD-10-CM

## 2022-08-01 DIAGNOSIS — Z113 Encounter for screening for infections with a predominantly sexual mode of transmission: Secondary | ICD-10-CM

## 2022-08-01 LAB — POCT URINALYSIS DIPSTICK OB
Bilirubin, UA: NEGATIVE
Blood, UA: NEGATIVE
Glucose, UA: NEGATIVE
Ketones, UA: NEGATIVE
Leukocytes, UA: NEGATIVE
Nitrite, UA: NEGATIVE
Spec Grav, UA: >=1.03 — AB (ref 1.010–1.025)
Urobilinogen, UA: 0.2 E.U./dL
pH, UA: 5 (ref 5.0–8.0)

## 2022-08-01 NOTE — Progress Notes (Signed)
ROB- no concerns

## 2022-08-01 NOTE — Patient Instructions (Signed)

## 2022-08-01 NOTE — Progress Notes (Signed)
Routine Prenatal Care Visit  Subjective  Dana Murphy is a 21 y.o. G2P0010 at 51w5dbeing seen today for ongoing prenatal care.  She is currently monitored for the following issues for this high-risk pregnancy and has Thyroid nodule; Enlarged thyroid; Supervision of high risk pregnancy, antepartum; Obesity affecting pregnancy; Family history of diabetes mellitus (DM); Choroid plexus cyst; and Fetal cardiac anomaly complicating pregnancy, antepartum on their problem list.  ----------------------------------------------------------------------------------- Patient reports no complaints.   Contractions: Not present. Vag. Bleeding: None.  Movement: Present. Leaking Fluid denies.  ----------------------------------------------------------------------------------- The following portions of the patient's history were reviewed and updated as appropriate: allergies, current medications, past family history, past medical history, past social history, past surgical history and problem list. Problem list updated.  Objective  Blood pressure 119/68, weight 251 lb (113.9 kg), last menstrual period 01/15/2022 Pregravid weight 248 lb (112.5 kg) Total Weight Gain 3 lb (1.361 kg) Urinalysis: Urine Protein    Urine Glucose    Fetal Status: Fetal Heart Rate (bpm): 154 Fundal Height: 27 cm Movement: Present     General:  Alert, oriented and cooperative. Patient is in no acute distress.  Skin: Skin is warm and dry. No rash noted.   Cardiovascular: Normal heart rate noted  Respiratory: Normal respiratory effort, no problems with respiration noted  Abdomen: Soft, gravid, appropriate for gestational age. Pain/Pressure: Absent     Pelvic:  Cervical exam deferred        Extremities: Normal range of motion.  Edema: None  Mental Status: Normal mood and affect. Normal behavior. Normal judgment and thought content.   Assessment   21y.o. 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      Supervision of high risk pregnancy, antepartum 02/21/2022 by JoCleophas DunkerCMA No   Overview Addendum 06/07/2022  4:21 PM by FrImagene RichesCNM     Nursing Staff Provider  Office Location  [facility] Dating  LMP/8w75w2d    Language   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, hypoplastic nasal bone  Flu Vaccine   Genetic/Carrier Screen  NIPS: drawn 06/07/22   AFP:    Horizon:  TDaP Vaccine    Hgb A1C or  GTT Early  Third trimester   COVID Vaccine    LAB RESULTS   Rhogam   Blood Type A/Positive/-- (06/12 1033)   Baby Feeding Plan  breast Antibody Negative (06/12 1033)  Contraception  Rubella 2.19 (06/12 1033)  Circumcision  RPR Non Reactive (06/12 1033)   Pediatrician   HBsAg Negative (06/12 1033)   Support Person  FOB HCVAb   Prenatal Classes  HIV Non Reactive (06/12 1033)     BTL Consent  GBS   (For PCN allergy, check sensitivities)   VBAC Consent  Pap        DME Rx '[ ]'$  BP cuff '[ ]'$  Weight Scale Waterbirth  '[ ]'$  Class '[ ]'$  Consent '[ ]'$  CNM visit  PHQ9 & GAD7 [  ] new OB [  ] 28 weeks  [  ] 36 weeks Induction  '[ ]'$   Orders Entered '[ ]'$ Foley Y/N              Preterm labor symptoms and general obstetric precautions including but not limited to vaginal bleeding, contractions, leaking of fluid and fetal movement were reviewed in detail with the patient. Please refer to After Visit Summary for other counseling recommendations.   Return in about 2 weeks (around 08/15/2022) for 28 wk labs and rob.  Rod Can, CNM 08/01/2022 4:26 PM

## 2022-08-01 NOTE — Addendum Note (Signed)
Addended by: Drenda Freeze on: 08/01/2022 04:30 PM   Modules accepted: Orders

## 2022-08-02 ENCOUNTER — Other Ambulatory Visit: Payer: Self-pay

## 2022-08-02 DIAGNOSIS — G930 Cerebral cysts: Secondary | ICD-10-CM

## 2022-08-02 DIAGNOSIS — O24112 Pre-existing diabetes mellitus, type 2, in pregnancy, second trimester: Secondary | ICD-10-CM

## 2022-08-02 DIAGNOSIS — O0990 Supervision of high risk pregnancy, unspecified, unspecified trimester: Secondary | ICD-10-CM

## 2022-08-02 DIAGNOSIS — O99212 Obesity complicating pregnancy, second trimester: Secondary | ICD-10-CM

## 2022-08-04 ENCOUNTER — Other Ambulatory Visit: Payer: Self-pay

## 2022-08-04 ENCOUNTER — Ambulatory Visit: Payer: BC Managed Care – PPO | Attending: Maternal & Fetal Medicine

## 2022-08-04 VITALS — BP 132/81 | HR 94 | Temp 98.1°F | Ht 69.0 in | Wt 252.0 lb

## 2022-08-04 DIAGNOSIS — O99212 Obesity complicating pregnancy, second trimester: Secondary | ICD-10-CM | POA: Diagnosis not present

## 2022-08-04 DIAGNOSIS — G930 Cerebral cysts: Secondary | ICD-10-CM

## 2022-08-04 DIAGNOSIS — O35AXX0 Maternal care for other (suspected) fetal abnormality and damage, fetal facial anomalies, not applicable or unspecified: Secondary | ICD-10-CM

## 2022-08-04 DIAGNOSIS — Z3A27 27 weeks gestation of pregnancy: Secondary | ICD-10-CM | POA: Diagnosis not present

## 2022-08-04 DIAGNOSIS — O35BXX0 Maternal care for other (suspected) fetal abnormality and damage, fetal cardiac anomalies, not applicable or unspecified: Secondary | ICD-10-CM

## 2022-08-04 DIAGNOSIS — O0990 Supervision of high risk pregnancy, unspecified, unspecified trimester: Secondary | ICD-10-CM

## 2022-08-04 DIAGNOSIS — O24112 Pre-existing diabetes mellitus, type 2, in pregnancy, second trimester: Secondary | ICD-10-CM

## 2022-08-04 DIAGNOSIS — O3503X0 Maternal care for (suspected) central nervous system malformation or damage in fetus, choroid plexus cysts, not applicable or unspecified: Secondary | ICD-10-CM | POA: Diagnosis not present

## 2022-08-04 DIAGNOSIS — E669 Obesity, unspecified: Secondary | ICD-10-CM

## 2022-08-04 DIAGNOSIS — Z362 Encounter for other antenatal screening follow-up: Secondary | ICD-10-CM | POA: Diagnosis not present

## 2022-08-16 ENCOUNTER — Encounter: Payer: Self-pay | Admitting: Licensed Practical Nurse

## 2022-08-16 ENCOUNTER — Ambulatory Visit (INDEPENDENT_AMBULATORY_CARE_PROVIDER_SITE_OTHER): Payer: BC Managed Care – PPO | Admitting: Licensed Practical Nurse

## 2022-08-16 ENCOUNTER — Other Ambulatory Visit: Payer: BC Managed Care – PPO

## 2022-08-16 VITALS — BP 122/79 | HR 90 | Wt 254.0 lb

## 2022-08-16 DIAGNOSIS — Z369 Encounter for antenatal screening, unspecified: Secondary | ICD-10-CM

## 2022-08-16 DIAGNOSIS — Z130 Encounter for screening for diseases of the blood and blood-forming organs and certain disorders involving the immune mechanism: Secondary | ICD-10-CM | POA: Diagnosis not present

## 2022-08-16 DIAGNOSIS — O09293 Supervision of pregnancy with other poor reproductive or obstetric history, third trimester: Secondary | ICD-10-CM

## 2022-08-16 DIAGNOSIS — O0990 Supervision of high risk pregnancy, unspecified, unspecified trimester: Secondary | ICD-10-CM | POA: Diagnosis not present

## 2022-08-16 DIAGNOSIS — E669 Obesity, unspecified: Secondary | ICD-10-CM

## 2022-08-16 DIAGNOSIS — Z3A29 29 weeks gestation of pregnancy: Secondary | ICD-10-CM

## 2022-08-16 DIAGNOSIS — Z131 Encounter for screening for diabetes mellitus: Secondary | ICD-10-CM

## 2022-08-16 DIAGNOSIS — E041 Nontoxic single thyroid nodule: Secondary | ICD-10-CM

## 2022-08-16 DIAGNOSIS — O99213 Obesity complicating pregnancy, third trimester: Secondary | ICD-10-CM

## 2022-08-16 DIAGNOSIS — Z113 Encounter for screening for infections with a predominantly sexual mode of transmission: Secondary | ICD-10-CM

## 2022-08-16 DIAGNOSIS — O99283 Endocrine, nutritional and metabolic diseases complicating pregnancy, third trimester: Secondary | ICD-10-CM

## 2022-08-16 LAB — POCT URINALYSIS DIPSTICK
Bilirubin, UA: NEGATIVE
Blood, UA: NEGATIVE
Glucose, UA: POSITIVE — AB
Ketones, UA: NEGATIVE
Nitrite, UA: 0.2
Protein, UA: NEGATIVE
Spec Grav, UA: 1.01 (ref 1.010–1.025)
Urobilinogen, UA: 0.2 E.U./dL
pH, UA: 5 (ref 5.0–8.0)

## 2022-08-16 NOTE — Progress Notes (Signed)
Routine Prenatal Care Visit  Subjective  Dana Murphy is a 21 y.o. G2P0010 at 10w6dbeing seen today for ongoing prenatal care.  She is currently monitored for the following issues for this high-risk pregnancy and has Thyroid nodule; Enlarged thyroid; Supervision of high risk pregnancy, antepartum; Obesity affecting pregnancy; Family history of diabetes mellitus (DM); Choroid plexus cyst; and Fetal cardiac anomaly complicating pregnancy, antepartum on their problem list.  ----------------------------------------------------------------------------------- Patient reports fatigue.  Doing well. Has been tired a lot. Works at a[employer] they are in the busy time, Does a lot of walking.  Plans to breastfeed  Normal growth on 11/2, will repeat [place]ain 4 weeks  Contractions: Not present. Vag. Bleeding: None.  Movement: Present. Leaking Fluid denies.  ----------------------------------------------------------------------------------- The following portions of the patient's history were reviewed and updated as appropriate: allergies, current medications, past family history, past medical history, past social history, past surgical history and problem list. Problem list updated.  Objective  Blood pressure 122/79, pulse 90, weight 254 lb (115.2 kg), last menstrual period 01/15/2022, unknown if currently breastfeeding. Pregravid weight 248 lb (112.5 kg) Total Weight Gain 6 lb (2.722 kg) Urinalysis: Urine Protein    Urine Glucose    Fetal Status: Fetal Heart Rate (bpm): 140 Fundal Height: 30 cm Movement: Present     General:  Alert, oriented and cooperative. Patient is in no acute distress.  Skin: Skin is warm and dry. No rash noted.   Cardiovascular: Normal heart rate noted  Respiratory: Normal respiratory effort, no problems with respiration noted  Abdomen: Soft, gravid, appropriate for gestational age. Pain/Pressure: Absent     Pelvic:  Cervical exam deferred        Extremities: Normal range of  motion.  Edema: Trace  Mental Status: Normal mood and affect. Normal behavior. Normal judgment and thought content.   Assessment   21y.o. 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      Supervision of high risk pregnancy, antepartum 02/21/2022 by JoCleophas DunkerCMA No   Overview Addendum 06/07/2022  4:21 PM by FrImagene RichesCNM     Nursing Staff Provider  Office Location  [facility] Dating  LMP/8w54w2d    Language   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, hypoplastic nasal bone  Flu Vaccine   Genetic/Carrier Screen  NIPS: drawn 06/07/22   AFP:    Horizon:  TDaP Vaccine    Hgb A1C or  GTT Early  Third trimester   COVID Vaccine    LAB RESULTS   Rhogam   Blood Type A/Positive/-- (06/12 1033)   Baby Feeding Plan  breast Antibody Negative (06/12 1033)  Contraception  Rubella 2.19 (06/12 1033)  Circumcision  RPR Non Reactive (06/12 1033)   Pediatrician   HBsAg Negative (06/12 1033)   Support Person  FOB HCVAb   Prenatal Classes  HIV Non Reactive (06/12 1033)     BTL Consent  GBS   (For PCN allergy, check sensitivities)   VBAC Consent  Pap        DME Rx '[ ]'$   BP cuff '[ ]'$  Weight Scale Waterbirth  '[ ]'$  Class '[ ]'$  Consent '[ ]'$  CNM visit  PHQ9 & GAD7 [  ] new OB [  ] 28 weeks  [  ] 36 weeks Induction  '[ ]'$  Orders Entered '[ ]'$ Foley Y/N             Preterm labor symptoms and general obstetric precautions including but not limited to vaginal bleeding, contractions, leaking of fluid and fetal movement were reviewed in detail with the patient. Please refer to After Visit Summary for other counseling  recommendations.   Return in about 2 weeks (around 08/30/2022) for ROB. 28wk labs collected  [place], [place]  , [facility]  08/19/22  6:13 PM

## 2022-08-17 LAB — 28 WEEK RH+PANEL
Basophils Absolute: 0 10*3/uL (ref 0.0–0.2)
Basos: 0 %
EOS (ABSOLUTE): 0.5 10*3/uL — ABNORMAL HIGH (ref 0.0–0.4)
Eos: 4 %
Gestational Diabetes Screen: 156 mg/dL — ABNORMAL HIGH (ref 70–139)
HIV Screen 4th Generation wRfx: NONREACTIVE
Hematocrit: 34.9 % (ref 34.0–46.6)
Hemoglobin: 11.7 g/dL (ref 11.1–15.9)
Immature Grans (Abs): 0.1 10*3/uL (ref 0.0–0.1)
Immature Granulocytes: 1 %
Lymphocytes Absolute: 1.9 10*3/uL (ref 0.7–3.1)
Lymphs: 15 %
MCH: 30.1 pg (ref 26.6–33.0)
MCHC: 33.5 g/dL (ref 31.5–35.7)
MCV: 90 fL (ref 79–97)
Monocytes Absolute: 0.5 10*3/uL (ref 0.1–0.9)
Monocytes: 4 %
Neutrophils Absolute: 9.8 10*3/uL — ABNORMAL HIGH (ref 1.4–7.0)
Neutrophils: 76 %
Platelets: 202 10*3/uL (ref 150–450)
RBC: 3.89 x10E6/uL (ref 3.77–5.28)
RDW: 11.9 % (ref 11.7–15.4)
RPR Ser Ql: NONREACTIVE
WBC: 12.8 10*3/uL — ABNORMAL HIGH (ref 3.4–10.8)

## 2022-08-18 LAB — URINE CULTURE

## 2022-08-23 ENCOUNTER — Other Ambulatory Visit: Payer: Self-pay | Admitting: Advanced Practice Midwife

## 2022-08-23 DIAGNOSIS — R7309 Other abnormal glucose: Secondary | ICD-10-CM

## 2022-08-23 NOTE — Progress Notes (Signed)
1 hr gtt elevated. 3 hr gtt ordered. Message to patient. Message to front desk to schedule 3 hr lab-only visit.

## 2022-08-31 ENCOUNTER — Encounter: Payer: Self-pay | Admitting: Obstetrics and Gynecology

## 2022-08-31 ENCOUNTER — Ambulatory Visit (INDEPENDENT_AMBULATORY_CARE_PROVIDER_SITE_OTHER): Payer: BC Managed Care – PPO | Admitting: Obstetrics and Gynecology

## 2022-08-31 VITALS — BP 130/72 | HR 99 | Wt 257.6 lb

## 2022-08-31 DIAGNOSIS — E669 Obesity, unspecified: Secondary | ICD-10-CM

## 2022-08-31 DIAGNOSIS — Z23 Encounter for immunization: Secondary | ICD-10-CM | POA: Diagnosis not present

## 2022-08-31 DIAGNOSIS — Z3A31 31 weeks gestation of pregnancy: Secondary | ICD-10-CM

## 2022-08-31 DIAGNOSIS — O0990 Supervision of high risk pregnancy, unspecified, unspecified trimester: Secondary | ICD-10-CM

## 2022-08-31 DIAGNOSIS — O99212 Obesity complicating pregnancy, second trimester: Secondary | ICD-10-CM

## 2022-08-31 DIAGNOSIS — O99213 Obesity complicating pregnancy, third trimester: Secondary | ICD-10-CM

## 2022-08-31 LAB — POCT URINALYSIS DIPSTICK OB
Bilirubin, UA: NEGATIVE
Blood, UA: NEGATIVE
Glucose, UA: NEGATIVE
Ketones, UA: NEGATIVE
Leukocytes, UA: NEGATIVE
Nitrite, UA: NEGATIVE
POC,PROTEIN,UA: NEGATIVE
Spec Grav, UA: 1.01 (ref 1.010–1.025)
Urobilinogen, UA: 1 E.U./dL
pH, UA: 6 (ref 5.0–8.0)

## 2022-08-31 NOTE — Progress Notes (Signed)
RPR:XYVOP some fatigue but otherwise doing well. Scheduled for 3 hr GTT next week due to abnormal 1 hour glucola performed. Next [place] to be scheduled at 32 weeks, but is not yet scheduled (being seen with MFM to f/u growth for absent nasal bone and choroid plexus cysts noted on anatomy scan). Patient notes she will reach out to them. Plans to breastfeed, desires Natural Family Planning for contraception. For Tdap today, signed blood consent.  Declines flu vaccine. RTC in 2 weeks.

## 2022-08-31 NOTE — Patient Instructions (Signed)

## 2022-09-01 ENCOUNTER — Encounter: Payer: Self-pay | Admitting: Obstetrics and Gynecology

## 2022-09-01 ENCOUNTER — Other Ambulatory Visit: Payer: Self-pay

## 2022-09-01 DIAGNOSIS — O99213 Obesity complicating pregnancy, third trimester: Secondary | ICD-10-CM

## 2022-09-06 ENCOUNTER — Other Ambulatory Visit: Payer: Self-pay

## 2022-09-06 ENCOUNTER — Other Ambulatory Visit: Payer: BC Managed Care – PPO

## 2022-09-06 ENCOUNTER — Ambulatory Visit: Payer: BC Managed Care – PPO | Attending: Obstetrics and Gynecology

## 2022-09-06 VITALS — BP 101/51 | HR 122 | Temp 98.2°F | Ht 69.0 in | Wt 257.0 lb

## 2022-09-06 DIAGNOSIS — E669 Obesity, unspecified: Secondary | ICD-10-CM | POA: Diagnosis not present

## 2022-09-06 DIAGNOSIS — O3503X0 Maternal care for (suspected) central nervous system malformation or damage in fetus, choroid plexus cysts, not applicable or unspecified: Secondary | ICD-10-CM | POA: Diagnosis not present

## 2022-09-06 DIAGNOSIS — O0990 Supervision of high risk pregnancy, unspecified, unspecified trimester: Secondary | ICD-10-CM

## 2022-09-06 DIAGNOSIS — O358XX0 Maternal care for other (suspected) fetal abnormality and damage, not applicable or unspecified: Secondary | ICD-10-CM

## 2022-09-06 DIAGNOSIS — O99213 Obesity complicating pregnancy, third trimester: Secondary | ICD-10-CM | POA: Diagnosis not present

## 2022-09-06 DIAGNOSIS — O283 Abnormal ultrasonic finding on antenatal screening of mother: Secondary | ICD-10-CM

## 2022-09-06 DIAGNOSIS — Z3A31 31 weeks gestation of pregnancy: Secondary | ICD-10-CM | POA: Diagnosis not present

## 2022-09-06 DIAGNOSIS — O24419 Gestational diabetes mellitus in pregnancy, unspecified control: Secondary | ICD-10-CM | POA: Insufficient documentation

## 2022-09-06 DIAGNOSIS — Z362 Encounter for other antenatal screening follow-up: Secondary | ICD-10-CM | POA: Diagnosis not present

## 2022-09-06 DIAGNOSIS — R7309 Other abnormal glucose: Secondary | ICD-10-CM | POA: Diagnosis not present

## 2022-09-06 DIAGNOSIS — G930 Cerebral cysts: Secondary | ICD-10-CM

## 2022-09-07 LAB — GESTATIONAL GLUCOSE TOLERANCE
Glucose, Fasting: 91 mg/dL (ref 70–94)
Glucose, GTT - 1 Hour: 200 mg/dL — ABNORMAL HIGH (ref 70–179)
Glucose, GTT - 2 Hour: 202 mg/dL — ABNORMAL HIGH (ref 70–154)
Glucose, GTT - 3 Hour: 94 mg/dL (ref 70–139)

## 2022-09-10 ENCOUNTER — Other Ambulatory Visit: Payer: Self-pay | Admitting: Advanced Practice Midwife

## 2022-09-10 DIAGNOSIS — O24419 Gestational diabetes mellitus in pregnancy, unspecified control: Secondary | ICD-10-CM | POA: Insufficient documentation

## 2022-09-10 HISTORY — DX: Gestational diabetes mellitus in pregnancy, unspecified control: O24.419

## 2022-09-10 MED ORDER — ACCU-CHEK SOFTCLIX LANCETS MISC: 12 refills | Status: DC

## 2022-09-10 MED ORDER — ACCU-CHEK AVIVA PLUS W/DEVICE KIT: PACK | 0 refills | Status: DC

## 2022-09-10 MED ORDER — ACCU-CHEK AVIVA PLUS VI STRP: ORAL_STRIP | 12 refills | Status: DC

## 2022-09-10 NOTE — Progress Notes (Signed)
2/4 elevated on 3 hr gtt. Glucometer supplies ordered. Referral to lifestyles. Message to patient.

## 2022-09-15 ENCOUNTER — Ambulatory Visit (INDEPENDENT_AMBULATORY_CARE_PROVIDER_SITE_OTHER): Payer: BC Managed Care – PPO | Admitting: Obstetrics and Gynecology

## 2022-09-15 ENCOUNTER — Encounter: Payer: Self-pay | Admitting: Obstetrics and Gynecology

## 2022-09-15 VITALS — BP 125/85 | HR 101 | Wt 256.6 lb

## 2022-09-15 DIAGNOSIS — O24419 Gestational diabetes mellitus in pregnancy, unspecified control: Secondary | ICD-10-CM

## 2022-09-15 DIAGNOSIS — O0990 Supervision of high risk pregnancy, unspecified, unspecified trimester: Secondary | ICD-10-CM

## 2022-09-15 DIAGNOSIS — Z3A33 33 weeks gestation of pregnancy: Secondary | ICD-10-CM

## 2022-09-15 LAB — POCT URINALYSIS DIPSTICK OB
Bilirubin, UA: NEGATIVE
Blood, UA: NEGATIVE
Glucose, UA: NEGATIVE
Ketones, UA: NEGATIVE
Nitrite, UA: NEGATIVE
Spec Grav, UA: 1.015 (ref 1.010–1.025)
Urobilinogen, UA: 0.2 E.U./dL
pH, UA: 6.5 (ref 5.0–8.0)

## 2022-09-15 NOTE — Progress Notes (Signed)
ROB: Patient keeping sugars daily.  All within normal lipids with 1 exception.  Doing well with diet.  Reports daily fetal movement.  Has lifestyles appointment tomorrow.  Baby at 91st percentile for growth-has follow-up scheduled with MFM.  Consider 36-week weekly NSTs.

## 2022-09-15 NOTE — Progress Notes (Signed)
ROB. Patient states daily fetal movement. She has been checking her sugars, majority within limits. She is scheduled to see lifestyles tomorrow. Patient states no questions or concerns at this time.

## 2022-09-16 ENCOUNTER — Encounter: Payer: BC Managed Care – PPO | Attending: Advanced Practice Midwife | Admitting: *Deleted

## 2022-09-16 ENCOUNTER — Encounter: Payer: Self-pay | Admitting: *Deleted

## 2022-09-16 VITALS — BP 104/70 | Ht 69.0 in | Wt 255.4 lb

## 2022-09-16 DIAGNOSIS — O24419 Gestational diabetes mellitus in pregnancy, unspecified control: Secondary | ICD-10-CM | POA: Diagnosis not present

## 2022-09-16 DIAGNOSIS — Z3A00 Weeks of gestation of pregnancy not specified: Secondary | ICD-10-CM | POA: Diagnosis not present

## 2022-09-16 DIAGNOSIS — O24410 Gestational diabetes mellitus in pregnancy, diet controlled: Secondary | ICD-10-CM

## 2022-09-16 DIAGNOSIS — Z713 Dietary counseling and surveillance: Secondary | ICD-10-CM | POA: Diagnosis not present

## 2022-09-16 NOTE — Progress Notes (Signed)
Diabetes Self-Management Education  Visit Type: First/Initial  Appt. Start Time: 1000 Appt. End Time: 1130  09/16/2022  [name], identified by name and date of birth, is a 21 y.o. female with a diagnosis of Diabetes: Gestational Diabetes.   ASSESSMENT  Blood pressure 104/70, height _0  (1.753 m), weight 255 lb 6.4 oz (115.8 kg), last menstrual period 01/15/2022, estimated date of delivery 11/02/2022 Body mass index is 37.72 kg/m.   Diabetes Self-Management Education - 09/16/22 1631       Visit Information   Visit Type First/Initial      Initial Visit   Diabetes Type Gestational Diabetes    Date Diagnosed Dec 2023    Are you currently following a meal plan? Yes    What type of meal plan do you follow? "less carbs, less sugary drinks/snacks"    Are you taking your medications as prescribed? Yes      Health Coping   How would you rate your overall health? Good      Psychosocial Assessment   Patient Belief/Attitude about Diabetes Other (comment)   "a little stressed"   What is the hardest part about your diabetes right now, causing you the most concern, or is the most worrisome to you about your diabetes?   Making healty food and beverage choices    Self-care barriers None    Self-management support Doctor's office;Family    Patient Concerns Nutrition/Meal planning;Weight Control;Healthy Lifestyle    Special Needs None    Preferred Learning Style Visual;Hands on    Learning Readiness Ready    How often do you need to have someone help you when you read instructions, pamphlets, or other written materials from your doctor or pharmacy? 1 - Never    What is the last grade level you completed in school? high school      Pre-Education Assessment   Patient understands the diabetes disease and treatment process. Needs Instruction    Patient understands incorporating nutritional management into lifestyle. Needs Instruction    Patient undertands incorporating physical  activity into lifestyle. Needs Instruction    Patient understands using medications safely. Needs Instruction    Patient understands monitoring blood glucose, interpreting and using results Needs Review    Patient understands prevention, detection, and treatment of acute complications. Needs Instruction    Patient understands prevention, detection, and treatment of chronic complications. Needs Instruction    Patient understands how to develop strategies to address psychosocial issues. Needs Instruction    Patient understands how to develop strategies to promote health/change behavior. Needs Instruction      Complications   Last HgB A1C per patient/outside source 5.2 %   03/14/2022   How often do you check your blood sugar? 3-4 times/day    Fasting Blood glucose range (mg/dL) 70-129   FBG's 90-96 mg/dL 2/3 elevated   Postprandial Blood glucose range (mg/dL) 70-129;130-179   pp breakfast 91, 116 mg/dL; pp lunch 121-158 mg/dL 4/4 elevated; pp supper 129 and 156 mg/dL   Have you had a dilated eye exam in the past 12 months? No    Have you had a dental exam in the past 12 months? No    Are you checking your feet? No      Dietary Intake   Breakfast fruit (banana, grapes, pineapple); hashbrown bowl from [employer], [place] yogurt    Snack (morning) 2-3 snacks/day - fruit, cheese, nuts, meat and cheese    Lunch chicken enchilada, BBQ and mac-n-cheese, hot dogs  Dinner chicken, beef, pork; potatoes, peas, beans, corn, pasta, rice, gren beans, salads    Beverage(s) water, sugar sweetened coffee, juice      Activity / Exercise   Activity / Exercise Type Light (walking / raking leaves)    How many days per week do you exercise? 3.5    How many minutes per day do you exercise? 17.5    Total minutes per week of exercise 61.25      Patient Education   Previous Diabetes Education No    Disease Pathophysiology Definition of diabetes, type 1 and 2, and the diagnosis of diabetes;Factors that  contribute to the development of diabetes    Healthy Eating Role of diet in the treatment of diabetes and the relationship between the three main macronutrients and blood glucose level;Food label reading, portion sizes and measuring food.;Reviewed blood glucose goals for pre and post meals and how to evaluate the patients' food intake on their blood glucose level.    Being Active Role of exercise on diabetes management, blood pressure control and cardiac health.    Medications Other (comment)   Limited use of oral medications during pregnancy and potential for insulin.   Monitoring Purpose and frequency of SMBG.;Taught/discussed recording of test results and interpretation of SMBG.;Identified appropriate SMBG and/or A1C goals.;Ketone testing, when, how.    Chronic complications Relationship between chronic complications and blood glucose control    Diabetes Stress and Support Identified and addressed patients feelings and concerns about diabetes    Preconception care Pregnancy and GDM  Role of pre-pregnancy blood glucose control on the development of the fetus;Reviewed with patient blood glucose goals with pregnancy;Role of family planning for patients with diabetes      Individualized Goals (developed by patient)   Reducing Risk Other (comment)   lose weight, lead a healthier lifestyle     Outcomes   Expected Outcomes Demonstrated interest in learning. Expect positive outcomes    Future DMSE --   3 weeks        Individualized Plan for Diabetes Self-Management Training:   Learning Objective:  Patient will have a greater understanding of diabetes self-management. Patient education plan is to attend individual and/or group sessions per assessed needs and concerns.   Plan:   Patient Instructions  Read booklet on Gestational Diabetes Follow Gestational Meal Planning Guidelines Avoid sugar sweetened drinks (juice, coffee) Allow 2-3 hours between meals and snacks Complete a 3 Day Food Record  and bring to next appointment Check blood sugars 4 x day - before breakfast and 2 hrs after every meal and record  Bring blood sugar log to all appointments Purchase urine ketone strips if instructed by MD and check urine ketones every am:  If + increase bedtime snack to 1 protein and 2 carbohydrate servings Walk 20-30 minutes at least 5 x week if permitted by MD  Expected Outcomes:  Demonstrated interest in learning. Expect positive outcomes  Education material provided:  Gestational Booklet Gestational Meal Planning Guidelines Simple Meal Plan 3 Day Food Record Goals for a Healthy Pregnancy  If problems or questions, patient to contact team via:   Johny Drilling, RN, [place] [phone]  Future DSME appointment:  (3 weeks) Will call back on Mon to schedule f/u with RD on week of Jan 2 per pt's request

## 2022-09-16 NOTE — Patient Instructions (Signed)
Read booklet on Gestational Diabetes Follow Gestational Meal Planning Guidelines Avoid sugar sweetened drinks (juice, coffee) Allow 2-3 hours between meals and snacks Complete a 3 Day Food Record and bring to next appointment Check blood sugars 4 x day - before breakfast and 2 hrs after every meal and record  Bring blood sugar log to all appointments Purchase urine ketone strips if instructed by MD and check urine ketones every am:  If + increase bedtime snack to 1 protein and 2 carbohydrate servings Walk 20-30 minutes at least 5 x week if permitted by MD

## 2022-10-03 DIAGNOSIS — O24419 Gestational diabetes mellitus in pregnancy, unspecified control: Secondary | ICD-10-CM

## 2022-10-03 HISTORY — DX: Gestational diabetes mellitus in pregnancy, unspecified control: O24.419

## 2022-10-03 NOTE — L&D Delivery Note (Signed)
Operative Delivery Note At  a viable female was delivered via [place] .  Presentation: vertex; Position: OA/ 20 degree ROA; Station: +3./3  Verbal consent: obtained from patient.  Risks and benefits discussed in detail.  Risks include, but are not limited to the risks of anesthesia, bleeding, infection, damage to maternal tissues, fetal cephalhematoma.  There is also the risk of inability to effect vaginal delivery of the head, or shoulder dystocia that cannot be resolved by established maneuvers, leading to the need for emergency cesarean section.  APGAR:8/9 , ; weight #8/10 .   Placenta status: , .meconium stained    Cord:  with the following complications: .  Cord pH: n/a  Anesthesia:  CLE Instruments: Simpson forceps  Episiotomy:  none Lacerations:  2nd degree and small bilateral labial lacs Suture Repair: 2.0 3.0 vicryl Est. Blood Loss (mL):  300cc  Mom to postpartum.  Baby to Couplet care / Skin to Skin.  Gwen Her Kionna Brier 11/02/2022, 6:32 AM  Delivery Note At 5:50 AM a viable female was delivered via Vaginal, Forceps (Presentation:      ).  APGAR: 8, 9; weight  .   Placenta status: Spontaneous, Intact.  Cord: 3 vessels with the following complications:  .  Cord pH: n/a  Anesthesia: Epidural Episiotomy: None Lacerations: 2nd degree;Perineal;Labial Suture Repair: 2.0 3.0 vicryl Est. Blood Loss (mL):  300  Mom to postpartum.  Baby to Couplet care / Skin to Skin.  Gwen Her Marvyn Torrez 11/02/2022, 6:32 AM

## 2022-10-05 ENCOUNTER — Other Ambulatory Visit (HOSPITAL_COMMUNITY)
Admission: RE | Admit: 2022-10-05 | Discharge: 2022-10-05 | Disposition: A | Discharge status: Home / Self Care | Payer: BC Managed Care – PPO | Source: Ambulatory Visit | Attending: Licensed Practical Nurse | Admitting: Licensed Practical Nurse

## 2022-10-05 ENCOUNTER — Encounter: Payer: Self-pay | Admitting: Licensed Practical Nurse

## 2022-10-05 ENCOUNTER — Ambulatory Visit (INDEPENDENT_AMBULATORY_CARE_PROVIDER_SITE_OTHER): Payer: BC Managed Care – PPO | Admitting: Licensed Practical Nurse

## 2022-10-05 VITALS — BP 122/84 | HR 97 | Wt 258.8 lb

## 2022-10-05 DIAGNOSIS — Z113 Encounter for screening for infections with a predominantly sexual mode of transmission: Secondary | ICD-10-CM

## 2022-10-05 DIAGNOSIS — Z3A36 36 weeks gestation of pregnancy: Secondary | ICD-10-CM

## 2022-10-05 DIAGNOSIS — Z3685 Encounter for antenatal screening for Streptococcus B: Secondary | ICD-10-CM

## 2022-10-05 DIAGNOSIS — O0990 Supervision of high risk pregnancy, unspecified, unspecified trimester: Secondary | ICD-10-CM | POA: Insufficient documentation

## 2022-10-05 LAB — POCT URINALYSIS DIPSTICK
Bilirubin, UA: NEGATIVE
Blood, UA: NEGATIVE
Glucose, UA: NEGATIVE
Ketones, UA: NEGATIVE
Leukocytes, UA: NEGATIVE
Nitrite, UA: NEGATIVE
Protein, UA: NEGATIVE
Spec Grav, UA: 1.015 (ref 1.010–1.025)
Urobilinogen, UA: 1 E.U./dL
pH, UA: 5.5 (ref 5.0–8.0)

## 2022-10-05 NOTE — Progress Notes (Signed)
Routine Prenatal Care Visit  Subjective  Dana Murphy is a 22 y.o. G2P0010 at 27w0dbeing seen today for ongoing prenatal care.  She is currently monitored for the following issues for this high-risk pregnancy and has Thyroid nodule; Enlarged thyroid; Supervision of high risk pregnancy, antepartum; Obesity affecting pregnancy; Family history of diabetes mellitus (DM); Choroid plexus cyst; Abnormal ultrasonic finding on antenatal screening of mother; and Gestational diabetes mellitus (GDM) affecting second pregnancy on their problem list.  ----------------------------------------------------------------------------------- Patient reports no complaints.  Doing well. Forgot blood sugar log in car, reports 3-4 after meals have been abnormal, she knows it is when she eat certain things that her sugars will be elevated.  -taking CBE online -Husband will be off for 2 weeks after the birth, they have some family around as well   Contractions: Not present.  .  Movement: Present. Leaking Fluid denies.  ----------------------------------------------------------------------------------- The following portions of the patient's history were reviewed and updated as appropriate: allergies, current medications, past family history, past medical history, past social history, past surgical history and problem list. Problem list updated.  Objective  Blood pressure 122/84, pulse 97, weight 258 lb 12.8 oz (117.4 kg), last menstrual period 01/15/2022, unknown if currently breastfeeding. Pregravid weight 248 lb (112.5 kg) Total Weight Gain 10 lb 12.8 oz (4.899 kg) Urinalysis: Urine Protein    Urine Glucose    Fetal Status: Fetal Heart Rate (bpm): 140 Fundal Height: 38 cm Movement: Present     General:  Alert, oriented and cooperative. Patient is in no acute distress.  Skin: Skin is warm and dry. No rash noted.   Cardiovascular: Normal heart rate noted  Respiratory: Normal respiratory effort, no problems with  respiration noted  Abdomen: Soft, gravid, appropriate for gestational age. Pain/Pressure: Absent     Pelvic:  Cervical exam deferred        Extremities: Normal range of motion.  Edema: Trace  Mental Status: Normal mood and affect. Normal behavior. Normal judgment and thought content.   Assessment   22y.o. 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      Supervision of high risk pregnancy, antepartum 02/21/2022 by JoCleophas DunkerCMA No   Overview Addendum 08/31/2022  3:15 PM by ChRubie MaidMD     Nursing Staff Provider  Office Location  [facility] Dating  LMP/8w70w2d    Language  English 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, hypoplastic nasal bone  Flu Vaccine  Declines Genetic/Carrier Screen  NIPS: drawn 06/07/22, nml AFP:    Horizon:  TDaP Vaccine   08/31/2022 Hgb A1C or  GTT Early  Third trimester: 156  COVID Vaccine    LAB RESULTS   Rhogam  N/A Blood Type A/Positive/-- (06/12 1033)   Baby Feeding Plan  breast Antibody Negative (06/12 1033)  Contraception  Rubella 2.19 (06/12 1033)  Circumcision  RPR Non Reactive (06/12 1033)   Pediatrician   HBsAg Negative (06/12 1033)   Support Person  FOB HCVAb   Prenatal Classes  HIV Non Reactive (  06/12 1033)     BTL Consent  GBS   (For PCN allergy, check sensitivities)   VBAC Consent  Pap        DME Rx '[ ]'$  BP cuff '[ ]'$  Weight Scale Waterbirth  '[ ]'$  Class '[ ]'$  Consent '[ ]'$  CNM visit  PHQ9 & GAD7 [  ] new OB [  ] 28 weeks  [  ] 36 weeks Induction  '[ ]'$  Orders Entered '[ ]'$ Foley Y/N             Preterm labor symptoms  and general obstetric precautions including but not limited to vaginal bleeding, contractions, leaking of fluid and fetal movement were reviewed in detail with the patient. Please refer to After Visit Summary for other counseling recommendations.   Return in about 1 week (around 10/12/2022) for ROB.  36wks labs collected  MFM fu in 1 week  Roberto Scales, [facility]  10/05/22  8:39 AM

## 2022-10-05 NOTE — Addendum Note (Signed)
Addended by: Inis Sizer on: 10/05/2022 08:50 AM   Modules accepted: Orders

## 2022-10-06 ENCOUNTER — Encounter: Payer: BC Managed Care – PPO | Attending: Advanced Practice Midwife | Admitting: Dietician

## 2022-10-06 ENCOUNTER — Encounter: Payer: Self-pay | Admitting: Dietician

## 2022-10-06 VITALS — BP 120/66 | Ht 69.0 in | Wt 261.0 lb

## 2022-10-06 DIAGNOSIS — O24410 Gestational diabetes mellitus in pregnancy, diet controlled: Secondary | ICD-10-CM | POA: Diagnosis not present

## 2022-10-06 LAB — CERVICOVAGINAL ANCILLARY ONLY
Chlamydia: NEGATIVE
Comment: NEGATIVE
Comment: NORMAL
Neisseria Gonorrhea: NEGATIVE

## 2022-10-06 NOTE — Patient Instructions (Signed)
Keep carb amounts to 3 servings or 45 grams with each meal.  Great job including protein foods and portions of starches! Try some light activity, even cleaning up dishes, or light walking, for at least 10 minutes after eating; this might help improve blood sugars after meals.

## 2022-10-06 NOTE — Progress Notes (Deleted)
Patient's BG record indicates fasting BGs ranging 90-100 + 2 readings of 103, 108, and post-meal BGs ranging 83-134 after breakfast, and 101-156 after lunch and dinner meals. She reports often falling asleep soon after dinner and checks BG >2hr after, or misses testing.  Patient's food diary indicates she is eating at regular intervals, including protein sources with meals, occasionally higher carb meals (intake was recorded over Christmas holiday).   Provided basic balanced meal plan, and wrote individualized menus based on patient's food preferences.  Instructed patient on food safety, including avoidance of Listeriosis, and limiting mercury from fish. Advised engaging in light activity for at least 10 minutes after eating meals to see if this helps better control post-meal BGs. Discussed importance of maintaining healthy lifestyle habits to reduce risk of Type 2 DM as well as Gestational DM with any future pregnancies. Advised patient to use any remaining testing supplies to test some BGs after delivery, and to have BG tested ideally annually, as well as prior to attempting future pregnancies.

## 2022-10-06 NOTE — Progress Notes (Signed)
Patient's BG record indicates fasting BGs ranging 90-100 + 2 readings of 103, 108, and post-meal BGs ranging 83-134 after breakfast, and 101-156 after lunch and dinner meals. She reports often falling asleep soon after dinner and checks BG >2hr after, or misses testing.  Patient's food diary indicates she is eating at regular intervals, including protein sources with meals, occasionally higher carb meals (intake was recorded over Christmas holiday).   Provided basic balanced meal plan, and wrote individualized menus based on patient's food preferences.  Instructed patient on food safety, including avoidance of Listeriosis, and limiting mercury from fish. Advised engaging in light activity for at least 10 minutes after eating meals to see if this helps better control post-meal BGs. Discussed importance of maintaining healthy lifestyle habits to reduce risk of Type 2 DM as well as Gestational DM with any future pregnancies. Advised patient to use any remaining testing supplies to test some BGs after delivery, and to have BG tested ideally annually, as well as prior to attempting future pregnancies.

## 2022-10-09 LAB — CULTURE, BETA STREP (GROUP B ONLY): Strep Gp B Culture: NEGATIVE

## 2022-10-10 ENCOUNTER — Other Ambulatory Visit: Payer: Self-pay

## 2022-10-10 DIAGNOSIS — O09299 Supervision of pregnancy with other poor reproductive or obstetric history, unspecified trimester: Secondary | ICD-10-CM

## 2022-10-10 DIAGNOSIS — O99213 Obesity complicating pregnancy, third trimester: Secondary | ICD-10-CM

## 2022-10-10 DIAGNOSIS — O0990 Supervision of high risk pregnancy, unspecified, unspecified trimester: Secondary | ICD-10-CM

## 2022-10-11 ENCOUNTER — Ambulatory Visit: Payer: BC Managed Care – PPO | Attending: Obstetrics and Gynecology

## 2022-10-11 ENCOUNTER — Other Ambulatory Visit: Payer: Self-pay

## 2022-10-11 VITALS — BP 123/88 | HR 85 | Temp 97.7°F | Ht 69.0 in | Wt 262.5 lb

## 2022-10-11 DIAGNOSIS — O283 Abnormal ultrasonic finding on antenatal screening of mother: Secondary | ICD-10-CM

## 2022-10-11 DIAGNOSIS — O3503X0 Maternal care for (suspected) central nervous system malformation or damage in fetus, choroid plexus cysts, not applicable or unspecified: Secondary | ICD-10-CM | POA: Insufficient documentation

## 2022-10-11 DIAGNOSIS — Z3A36 36 weeks gestation of pregnancy: Secondary | ICD-10-CM | POA: Insufficient documentation

## 2022-10-11 DIAGNOSIS — O99213 Obesity complicating pregnancy, third trimester: Secondary | ICD-10-CM | POA: Diagnosis not present

## 2022-10-11 DIAGNOSIS — O358XX0 Maternal care for other (suspected) fetal abnormality and damage, not applicable or unspecified: Secondary | ICD-10-CM | POA: Diagnosis not present

## 2022-10-11 DIAGNOSIS — O09299 Supervision of pregnancy with other poor reproductive or obstetric history, unspecified trimester: Secondary | ICD-10-CM

## 2022-10-11 DIAGNOSIS — E669 Obesity, unspecified: Secondary | ICD-10-CM

## 2022-10-11 DIAGNOSIS — G930 Cerebral cysts: Secondary | ICD-10-CM

## 2022-10-11 DIAGNOSIS — O0990 Supervision of high risk pregnancy, unspecified, unspecified trimester: Secondary | ICD-10-CM

## 2022-10-12 ENCOUNTER — Ambulatory Visit (INDEPENDENT_AMBULATORY_CARE_PROVIDER_SITE_OTHER): Payer: BC Managed Care – PPO | Admitting: Obstetrics and Gynecology

## 2022-10-12 VITALS — BP 117/78 | HR 83 | Wt 266.8 lb

## 2022-10-12 DIAGNOSIS — Z3A37 37 weeks gestation of pregnancy: Secondary | ICD-10-CM

## 2022-10-12 DIAGNOSIS — O0990 Supervision of high risk pregnancy, unspecified, unspecified trimester: Secondary | ICD-10-CM

## 2022-10-12 DIAGNOSIS — O24419 Gestational diabetes mellitus in pregnancy, unspecified control: Secondary | ICD-10-CM

## 2022-10-12 LAB — POCT URINALYSIS DIPSTICK OB
Bilirubin, UA: NEGATIVE
Blood, UA: NEGATIVE
Glucose, UA: NEGATIVE
Ketones, UA: NEGATIVE
Nitrite, UA: NEGATIVE
Spec Grav, UA: 1.01 (ref 1.010–1.025)
Urobilinogen, UA: 0.2 E.U./dL
pH, UA: 7.5 (ref 5.0–8.0)

## 2022-10-12 NOTE — Progress Notes (Signed)
ROB: Reports no problems.  Has occasional rare contractions.  Reports her sugars are well-controlled with fastings below 100 and her highest postprandial 136.  She did not bring her log today.  She reports daily fetal movement.  Most recent ultrasound showed baby at [place] percentile.

## 2022-10-21 ENCOUNTER — Encounter: Payer: Self-pay | Admitting: Licensed Practical Nurse

## 2022-10-21 ENCOUNTER — Ambulatory Visit (INDEPENDENT_AMBULATORY_CARE_PROVIDER_SITE_OTHER): Payer: BC Managed Care – PPO | Admitting: Licensed Practical Nurse

## 2022-10-21 VITALS — BP 131/85 | HR 111 | Wt 268.2 lb

## 2022-10-21 DIAGNOSIS — O24410 Gestational diabetes mellitus in pregnancy, diet controlled: Secondary | ICD-10-CM

## 2022-10-21 DIAGNOSIS — O0990 Supervision of high risk pregnancy, unspecified, unspecified trimester: Secondary | ICD-10-CM

## 2022-10-21 DIAGNOSIS — Z3A38 38 weeks gestation of pregnancy: Secondary | ICD-10-CM

## 2022-10-21 LAB — POCT URINALYSIS DIPSTICK
Bilirubin, UA: NEGATIVE
Blood, UA: NEGATIVE
Glucose, UA: NEGATIVE
Ketones, UA: NEGATIVE
Leukocytes, UA: NEGATIVE
Nitrite, UA: NEGATIVE
Protein, UA: NEGATIVE
Spec Grav, UA: 1.01 (ref 1.010–1.025)
Urobilinogen, UA: 1 E.U./dL
pH, UA: 6 (ref 5.0–8.0)

## 2022-10-21 NOTE — Progress Notes (Signed)
On closer look at glucose log once pt left the visit, it is noted that multiple days of recordings is missing. Reviewed log with Dr Marcelline Mates.  Given missing data, assumed pt had not checked her sugars on those days, and the elevations she does have we can assume she does not have well controlled blood sugars. It is too late to start oral hypoglycemic, the pt should be scheduled for IOL between 39-40 wks  Called LVM on pt's  phone Mychart message sent  describing conversation and plan with Dr Zollie Pee, [place], [facility]  10/21/22  5:13 PM

## 2022-10-21 NOTE — Progress Notes (Signed)
Routine Prenatal Care Visit  Subjective  Dana Murphy is a 22 y.o. G2P0010 at 30w2dbeing seen today for ongoing prenatal care.  She is currently monitored for the following issues for this high-risk pregnancy and has Thyroid nodule; Enlarged thyroid; Supervision of high risk pregnancy, antepartum; Obesity affecting pregnancy; Family history of diabetes mellitus (DM); Choroid plexus cyst; Abnormal ultrasonic finding on antenatal screening of mother; and Gestational diabetes mellitus (GDM) affecting second pregnancy on their problem list.  ----------------------------------------------------------------------------------- Patient reports no complaints.  Feeling good, mood has been good. Has been busy cleaning her house. Has had some B-H ctx's -GDM: 9/35 elevated, fasting's are elevated when she had a late night snack or eats something she should not the night before.    date fasting 2 hrs 2hrs 2 hrs  1/1  134    1/2 97 86 109   1/3 93 125 104   1/4 95 85 134   1/5      1/6 86 101 123 116  1/7 93 121 108 131  1/8 95 109  127  1/9  113 109 114  1/10      1/11 86 101 116 104  1/12      1/13      1/14      1/15      1/16 88 110 141 118  1/17 90 115 117     -Reviewed timing of birth, at this time time anticipate IOL at 41 wks, but if her BMI >40 or blood sugars are not well controlled we may rec IOL sooner.   Contractions: Irritability. Vag. Bleeding: None.  Movement: Present. Leaking Fluid denies.  ----------------------------------------------------------------------------------- The following portions of the patient's history were reviewed and updated as appropriate: allergies, current medications, past family history, past medical history, past social history, past surgical history and problem list. Problem list updated.  Objective  Blood pressure 131/85, pulse (!) 111, weight 268 lb 3.2 oz (121.7 kg), last menstrual period 01/15/2022, unknown if currently  breastfeeding. Pregravid weight 248 lb (112.5 kg) Total Weight Gain 20 lb 3.2 oz (9.163 kg) Urinalysis: Urine Protein    Urine Glucose    Fetal Status: Fetal Heart Rate (bpm): 150 Fundal Height: 40 cm Movement: Present     General:  Alert, oriented and cooperative. Patient is in no acute distress.  Skin: Skin is warm and dry. No rash noted.   Cardiovascular: Normal heart rate noted  Respiratory: Normal respiratory effort, no problems with respiration noted  Abdomen: Soft, gravid, appropriate for gestational age. Pain/Pressure: Absent     Pelvic:  Cervical exam deferred        Extremities: Normal range of motion.  Edema: Mild pitting, slight indentation  Mental Status: Normal mood and affect. Normal behavior. Normal judgment and thought content.   Assessment   22y.o. 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      Supervision of high risk pregnancy, antepartum 02/21/2022 by JoCleophas DunkerCMA No   Overview Addendum 08/31/2022  3:15 PM by ChRubie MaidMD  Nursing Staff Provider  Office Location  [facility] Dating  LMP/[redacted]w[redacted]d      Language  English Anatomy [place] Female, hypoplastic nasal bone  Flu Vaccine  Declines Genetic/Carrier Screen  NIPS: drawn 06/07/22, nml AFP:    Horizon:  TDaP Vaccine   08/31/2022 Hgb A1C or  GTT Early  Third trimester: 156  COVID Vaccine    LAB RESULTS   Rhogam  N/A Blood Type A/Positive/-- (06/12 1033)   Baby Feeding Plan  breast Antibody Negative (06/12 1033)  Contraception  Rubella 2.19 (06/12 1033)  Circumcision  RPR Non Reactive (06/12 1033)    Pediatrician   HBsAg Negative (06/12 1033)   Support Person  FOB HCVAb   Prenatal Classes  HIV Non Reactive (06/12 1033)     BTL Consent  GBS   (For PCN allergy, check sensitivities)   VBAC Consent  Pap        DME Rx '[ ]'$  BP cuff '[ ]'$  Weight Scale Waterbirth  '[ ]'$  Class '[ ]'$  Consent '[ ]'$  CNM visit  PHQ9 & GAD7 [  ] new OB [  ] 28 weeks  [  ] 36 weeks Induction  '[ ]'$  Orders Entered '[ ]'$ Foley Y/N             Term labor symptoms and general obstetric precautions including but not limited to vaginal bleeding, contractions, leaking of fluid and fetal movement were reviewed in detail with the patient. Please refer to After Visit Summary for other counseling recommendations.   Return in about 1 week (around 10/28/2022) for ROB.  LRoberto Scales [facility]  10/21/22  2:13 PM

## 2022-10-28 ENCOUNTER — Encounter: Payer: BC Managed Care – PPO | Admitting: Certified Nurse Midwife

## 2022-10-28 ENCOUNTER — Emergency Department
Admission: EM | Admit: 2022-10-28 | Discharge: 2022-10-28 | Disposition: A | Discharge status: Home / Self Care | Payer: BC Managed Care – PPO | Attending: Emergency Medicine | Admitting: Emergency Medicine

## 2022-10-28 ENCOUNTER — Telehealth: Payer: Self-pay

## 2022-10-28 ENCOUNTER — Other Ambulatory Visit: Payer: Self-pay

## 2022-10-28 DIAGNOSIS — E119 Type 2 diabetes mellitus without complications: Secondary | ICD-10-CM | POA: Insufficient documentation

## 2022-10-28 DIAGNOSIS — U071 COVID-19: Secondary | ICD-10-CM | POA: Insufficient documentation

## 2022-10-28 DIAGNOSIS — Z3A40 40 weeks gestation of pregnancy: Secondary | ICD-10-CM | POA: Diagnosis not present

## 2022-10-28 DIAGNOSIS — O98513 Other viral diseases complicating pregnancy, third trimester: Secondary | ICD-10-CM | POA: Insufficient documentation

## 2022-10-28 DIAGNOSIS — O99891 Other specified diseases and conditions complicating pregnancy: Secondary | ICD-10-CM | POA: Diagnosis not present

## 2022-10-28 LAB — RESP PANEL BY RT-PCR (RSV, FLU A&B, COVID)  RVPGX2
Influenza A by PCR: NEGATIVE
Influenza B by PCR: NEGATIVE
Resp Syncytial Virus by PCR: NEGATIVE
SARS Coronavirus 2 by RT PCR: POSITIVE — AB

## 2022-10-28 NOTE — Discharge Instructions (Signed)
Your swab was positive for COVID-19.  You may continue to take Tylenol per package instructions to help with any body aches or fever.  Please follow-up with your OB/GYN.  Please return for any new, worsening, or change in symptoms or other concerns.  It was a pleasure caring for you today.

## 2022-10-28 NOTE — ED Triage Notes (Addendum)
Pt to ED via POV from home. Pt is [redacted] wks pregnant. Pt reports started feeling bad on Wednesday and started running a fever. Pt reports nauseas and congested as well. Pt called OB office and they referred her to be seen in ER.   FHR 140

## 2022-10-28 NOTE — ED Provider Notes (Signed)
[facility] Provider Note    Event Date/Time   First MD Initiated Contact with Patient 10/28/22 1424     (approximate)   History   Nasal Congestion   HPI  SHI GROSE is a 22 y.o. female with a past medical history of diabetes, currently [redacted] weeks pregnant who presents for evaluation of nasal congestion for the past 2 days.  She was going to schedule her induction with OB/GYN today but they sent her to the emergency department to have a COVID/flu swab first.  Patient reports that her fevers have resolved.  She denies chest pain or shortness of breath.  She denies abdominal pain.  She has not had any nausea, vomiting, diarrhea.  No pregnancy concerns today.  Patient Active Problem List   Diagnosis Date Noted   Gestational diabetes mellitus (GDM) affecting second pregnancy 09/10/2022   Choroid plexus cyst 06/03/2022   Abnormal ultrasonic finding on antenatal screening of mother 06/03/2022   Obesity affecting pregnancy 03/14/2022   Family history of diabetes mellitus (DM) 03/14/2022   Supervision of high risk pregnancy, antepartum 02/21/2022   Thyroid nodule 02/26/2020   Enlarged thyroid 02/26/2020          Physical Exam   Triage Vital Signs: ED Triage Vitals  Enc Vitals Group     BP 10/28/22 1323 132/84     Pulse Rate 10/28/22 1323 (!) 112     Resp 10/28/22 1323 18     Temp 10/28/22 1323 98.7 F (37.1 C)     Temp src --      SpO2 10/28/22 1323 93 %     Weight --      Height --      Head Circumference --      Peak Flow --      Pain Score 10/28/22 1321 0     Pain Loc --      Pain Edu? --      Excl. in [place]? --     Most recent vital signs: Vitals:   10/28/22 1323  BP: 132/84  Pulse: (!) 112  Resp: 18  Temp: 98.7 F (37.1 C)  SpO2: 93%    Physical Exam Vitals and nursing note reviewed.  Constitutional:      General: Awake and alert. No acute distress.    Appearance: Normal appearance. The patient isoverweight.  HENT:      Head: Normocephalic and atraumatic.     Mouth: Mucous membranes are moist.  Eyes:     General: PERRL. Normal EOMs        Right eye: No discharge.        Left eye: No discharge.     Conjunctiva/sclera: Conjunctivae normal.  Cardiovascular:     Rate and Rhythm: Normal rate and regular rhythm.     Pulses: Normal pulses.  Pulmonary:     Effort: Pulmonary effort is normal. No respiratory distress.     Breath sounds: Normal breath sounds.  Abdominal:     Abdomen is gravid. There is no abdominal tenderness. No rebound or guarding. No distention. Musculoskeletal:        General: No swelling. Normal range of motion.     Cervical back: Normal range of motion and neck supple.  Skin:    General: Skin is warm and dry.     Capillary Refill: Capillary refill takes less than 2 seconds.     Findings: No rash.  Neurological:     Mental Status: The patient is awake  and alert.      ED Results / Procedures / Treatments   Labs (all labs ordered are listed, but only abnormal results are displayed) Labs Reviewed  RESP PANEL BY RT-PCR (RSV, FLU A&B, COVID)  RVPGX2 - Abnormal; Notable for the following components:      Result Value   SARS Coronavirus 2 by RT PCR POSITIVE (*)    All other components within normal limits     EKG     RADIOLOGY     PROCEDURES:  Critical Care performed:   Procedures   MEDICATIONS ORDERED IN ED: Medications - No data to display   IMPRESSION / MDM / [place] / ED COURSE  I reviewed the triage vital signs and the nursing notes.   Differential diagnosis includes, but is not limited to, COVID, flu, RSV, other URI.  Patient is awake and alert, mildly tachycardic but normotensive and afebrile.  She demonstrates no increased work of breathing.  I reviewed the patient's chart.  There is a phone call from today where she was instructed to come to the emergency department to be tested for COVID and flu.  Swab is positive for COVID-19.  I  discussed this diagnosis with the patient.  Her fetal heart tones obtained in triage are normal.  Patient has no abdominal pain or vaginal bleeding today.  She has no concerns about pregnancy today.  We discussed return precautions and outpatient management.  We discussed return precautions.  Patient understands and agrees with plan.  She was discharged in stable condition.  Patient's presentation is most consistent with acute complicated illness / injury requiring diagnostic workup.    FINAL CLINICAL IMPRESSION(S) / ED DIAGNOSES   Final diagnoses:  DTOIZ-12  WPYKD-98 affecting pregnancy in third trimester     Rx / DC Orders   ED Discharge Orders     None        Note:  This document was prepared using Dragon voice recognition software and may include unintentional dictation errors.   Emeline Gins 10/28/22 1433    Harvest Dark, MD 10/28/22 1558

## 2022-10-28 NOTE — Telephone Encounter (Signed)
Malvika called triage line, she stated when she got home from work she laid down and she stated she was running a fever but doesn't have a thermometer to check it at home, nausea, not feeling well at all.  Spoke with Deneise Lever and was advised to go to ED to be checked out and tested for Covid & Flu.

## 2022-10-31 ENCOUNTER — Telehealth: Payer: Self-pay

## 2022-10-31 NOTE — Telephone Encounter (Signed)
Pt calling; had appt Fri but had a fever; went to the ER; tested positive for covid; kept telling them she needed to be scheduled for an IOL but they never reached out to L&D.  [phone]  Pt aware IOL form faxed to L&D who will be calling her to schedule.

## 2022-11-01 ENCOUNTER — Encounter: Payer: Self-pay | Admitting: Obstetrics and Gynecology

## 2022-11-01 ENCOUNTER — Inpatient Hospital Stay
Admission: EM | Admit: 2022-11-01 | Discharge: 2022-11-03 | DRG: 805 | Disposition: A | Discharge status: Home / Self Care | Payer: BC Managed Care – PPO | Attending: Certified Nurse Midwife | Admitting: Certified Nurse Midwife

## 2022-11-01 ENCOUNTER — Other Ambulatory Visit: Payer: Self-pay

## 2022-11-01 ENCOUNTER — Inpatient Hospital Stay: Payer: BC Managed Care – PPO | Admitting: Anesthesiology

## 2022-11-01 DIAGNOSIS — O24410 Gestational diabetes mellitus in pregnancy, diet controlled: Secondary | ICD-10-CM | POA: Diagnosis not present

## 2022-11-01 DIAGNOSIS — O26893 Other specified pregnancy related conditions, third trimester: Secondary | ICD-10-CM | POA: Diagnosis not present

## 2022-11-01 DIAGNOSIS — O411030 Infection of amniotic sac and membranes, unspecified, third trimester, not applicable or unspecified: Secondary | ICD-10-CM | POA: Diagnosis not present

## 2022-11-01 DIAGNOSIS — O9852 Other viral diseases complicating childbirth: Secondary | ICD-10-CM | POA: Diagnosis present

## 2022-11-01 DIAGNOSIS — O411290 Chorioamnionitis, unspecified trimester, not applicable or unspecified: Secondary | ICD-10-CM | POA: Insufficient documentation

## 2022-11-01 DIAGNOSIS — Z3A39 39 weeks gestation of pregnancy: Secondary | ICD-10-CM

## 2022-11-01 DIAGNOSIS — Z23 Encounter for immunization: Secondary | ICD-10-CM | POA: Diagnosis not present

## 2022-11-01 DIAGNOSIS — O24420 Gestational diabetes mellitus in childbirth, diet controlled: Principal | ICD-10-CM | POA: Diagnosis present

## 2022-11-01 DIAGNOSIS — Z87891 Personal history of nicotine dependence: Secondary | ICD-10-CM | POA: Diagnosis not present

## 2022-11-01 DIAGNOSIS — O99214 Obesity complicating childbirth: Secondary | ICD-10-CM | POA: Diagnosis not present

## 2022-11-01 DIAGNOSIS — U071 COVID-19: Secondary | ICD-10-CM | POA: Diagnosis not present

## 2022-11-01 DIAGNOSIS — G930 Cerebral cysts: Principal | ICD-10-CM

## 2022-11-01 DIAGNOSIS — O98513 Other viral diseases complicating pregnancy, third trimester: Secondary | ICD-10-CM | POA: Diagnosis not present

## 2022-11-01 DIAGNOSIS — O411230 Chorioamnionitis, third trimester, not applicable or unspecified: Secondary | ICD-10-CM | POA: Diagnosis not present

## 2022-11-01 DIAGNOSIS — O283 Abnormal ultrasonic finding on antenatal screening of mother: Secondary | ICD-10-CM

## 2022-11-01 DIAGNOSIS — O0990 Supervision of high risk pregnancy, unspecified, unspecified trimester: Secondary | ICD-10-CM

## 2022-11-01 DIAGNOSIS — Z3A00 Weeks of gestation of pregnancy not specified: Secondary | ICD-10-CM | POA: Diagnosis not present

## 2022-11-01 LAB — GLUCOSE, CAPILLARY
Glucose-Capillary: 87 mg/dL (ref 70–99)
Glucose-Capillary: 80 mg/dL (ref 70–99)
Glucose-Capillary: 86 mg/dL (ref 70–99)
Glucose-Capillary: 108 mg/dL — ABNORMAL HIGH (ref 70–99)

## 2022-11-01 LAB — CBC
HCT: 37 % (ref 36.0–46.0)
Hemoglobin: 12.7 g/dL (ref 12.0–15.0)
MCH: 29.5 pg (ref 26.0–34.0)
MCHC: 34.3 g/dL (ref 30.0–36.0)
MCV: 86 fL (ref 80.0–100.0)
Platelets: 199 10*3/uL (ref 150–400)
RBC: 4.3 MIL/uL (ref 3.87–5.11)
RDW: 13.1 % (ref 11.5–15.5)
WBC: 11.7 10*3/uL — ABNORMAL HIGH (ref 4.0–10.5)
nRBC: 0 % (ref 0.0–0.2)

## 2022-11-01 LAB — RPR: RPR Ser Ql: NONREACTIVE

## 2022-11-01 LAB — TYPE AND SCREEN
ABO/RH(D): A POS
Antibody Screen: NEGATIVE

## 2022-11-01 MED ORDER — LIDOCAINE-EPINEPHRINE (PF) 1.5 %-1:200000 IJ SOLN
INTRAMUSCULAR | Status: DC | PRN
  Administered 2022-11-01: 3 mL via PERINEURAL

## 2022-11-01 MED ORDER — OXYTOCIN-SODIUM CHLORIDE 30-0.9 UT/500ML-% IV SOLN
1.0000 m[IU]/min | INTRAVENOUS | Status: DC
  Administered 2022-11-02: 2 m[IU]/min via INTRAVENOUS

## 2022-11-01 MED ORDER — PHENYLEPHRINE 80 MCG/ML (10ML) SYRINGE FOR IV PUSH (FOR BLOOD PRESSURE SUPPORT): 80.0000 ug | PREFILLED_SYRINGE | INTRAVENOUS | Status: DC | PRN

## 2022-11-01 MED ORDER — ACETAMINOPHEN 325 MG PO TABS
650.0000 mg | ORAL_TABLET | ORAL | Status: DC | PRN
  Administered 2022-11-01 (×2): 650 mg via ORAL
  Filled 2022-11-01 (×2): qty 2

## 2022-11-01 MED ORDER — OXYTOCIN-SODIUM CHLORIDE 30-0.9 UT/500ML-% IV SOLN
2.5000 [IU]/h | INTRAVENOUS | Status: DC
  Filled 2022-11-01: qty 500

## 2022-11-01 MED ORDER — LACTATED RINGERS IV SOLN: INTRAVENOUS | Status: DC

## 2022-11-01 MED ORDER — LACTATED RINGERS IV SOLN
500.0000 mL | INTRAVENOUS | Status: DC | PRN
  Administered 2022-11-01: 250 mL via INTRAVENOUS
  Administered 2022-11-02: 500 mL via INTRAVENOUS

## 2022-11-01 MED ORDER — ONDANSETRON HCL 4 MG/2ML IJ SOLN: 4.0000 mg | Freq: Four times a day (QID) | INTRAMUSCULAR | Status: DC | PRN

## 2022-11-01 MED ORDER — GENTAMICIN SULFATE 40 MG/ML IJ SOLN
5.0000 mg/kg | Freq: Once | INTRAVENOUS | Status: AC
  Administered 2022-11-02: 440 mg via INTRAVENOUS
  Filled 2022-11-01: qty 11

## 2022-11-01 MED ORDER — FENTANYL-BUPIVACAINE-NACL 0.5-0.125-0.9 MG/250ML-% EP SOLN
EPIDURAL | Status: AC
  Filled 2022-11-01: qty 250

## 2022-11-01 MED ORDER — DIPHENHYDRAMINE HCL 50 MG/ML IJ SOLN: 12.5000 mg | INTRAMUSCULAR | Status: DC | PRN

## 2022-11-01 MED ORDER — FENTANYL-BUPIVACAINE-NACL 0.5-0.125-0.9 MG/250ML-% EP SOLN
12.0000 mL/h | EPIDURAL | Status: DC | PRN
  Administered 2022-11-01: 12 mL/h via EPIDURAL
  Filled 2022-11-01: qty 250

## 2022-11-01 MED ORDER — SODIUM CHLORIDE 0.9 % IV SOLN
2.0000 g | Freq: Once | INTRAVENOUS | Status: AC
  Administered 2022-11-01: 2 g via INTRAVENOUS
  Filled 2022-11-01: qty 2000

## 2022-11-01 MED ORDER — AMMONIA AROMATIC IN INHA
RESPIRATORY_TRACT | Status: AC
  Filled 2022-11-01: qty 10

## 2022-11-01 MED ORDER — TERBUTALINE SULFATE 1 MG/ML IJ SOLN: 0.2500 mg | Freq: Once | INTRAMUSCULAR | Status: DC | PRN

## 2022-11-01 MED ORDER — OXYTOCIN 10 UNIT/ML IJ SOLN
INTRAMUSCULAR | Status: AC
  Filled 2022-11-01: qty 2

## 2022-11-01 MED ORDER — MISOPROSTOL 200 MCG PO TABS
ORAL_TABLET | ORAL | Status: AC
  Filled 2022-11-01: qty 4

## 2022-11-01 MED ORDER — OXYTOCIN BOLUS FROM INFUSION
333.0000 mL | Freq: Once | INTRAVENOUS | Status: AC
  Administered 2022-11-02: 333 mL via INTRAVENOUS

## 2022-11-01 MED ORDER — LIDOCAINE HCL (PF) 1 % IJ SOLN
30.0000 mL | INTRAMUSCULAR | Status: AC | PRN
  Administered 2022-11-02: 30 mL via SUBCUTANEOUS
  Filled 2022-11-01: qty 30

## 2022-11-01 MED ORDER — BUPIVACAINE HCL (PF) 0.25 % IJ SOLN
INTRAMUSCULAR | Status: DC | PRN
  Administered 2022-11-01: 4 mL via EPIDURAL

## 2022-11-01 MED ORDER — EPHEDRINE 5 MG/ML INJ: 10.0000 mg | INTRAVENOUS | Status: DC | PRN

## 2022-11-01 MED ORDER — LACTATED RINGERS IV SOLN
500.0000 mL | Freq: Once | INTRAVENOUS | Status: AC
  Administered 2022-11-01: 500 mL via INTRAVENOUS

## 2022-11-01 NOTE — Anesthesia Preprocedure Evaluation (Signed)
Anesthesia Evaluation  Patient identified by MRN, date of birth, ID band Patient awake    Reviewed: Allergy & Precautions, H&P , NPO status , Patient's Chart, lab work & pertinent test results  History of Anesthesia Complications Negative for: history of anesthetic complications  Airway Mallampati: II  TM Distance: >3 FB Neck ROM: full    Dental no notable dental hx.    Pulmonary neg pulmonary ROS, former smoker   Pulmonary exam normal        Cardiovascular negative cardio ROS Normal cardiovascular exam     Neuro/Psych negative neurological ROS  negative psych ROS   GI/Hepatic negative GI ROS, Neg liver ROS,,,  Endo/Other  diabetes    Renal/GU negative Renal ROS  negative genitourinary   Musculoskeletal   Abdominal   Peds  Hematology negative hematology ROS (+)   Anesthesia Other Findings   Reproductive/Obstetrics (+) Pregnancy                              Anesthesia Physical Anesthesia Plan  ASA: 2  Anesthesia Plan: Epidural   Post-op Pain Management:    Induction:   PONV Risk Score and Plan:   Airway Management Planned:   Additional Equipment:   Intra-op Plan:   Post-operative Plan:   Informed Consent: I have reviewed the patients History and Physical, chart, labs and discussed the procedure including the risks, benefits and alternatives for the proposed anesthesia with the patient or authorized representative who has indicated his/her understanding and acceptance.       Plan Discussed with: Anesthesiologist  Anesthesia Plan Comments:          Anesthesia Quick Evaluation

## 2022-11-01 NOTE — OB Triage Note (Signed)
Patient is a G2P0 at 72w6dwho presents to unit c/o ctx q3-5 min since 0530. Reports +FM, denies vaginal bleeding and LOF. External monitors applied and assessing. Initial FHT 135. Vital signs WDL.

## 2022-11-01 NOTE — Progress Notes (Signed)
  Labor Progress Note   22 y.o. G2P0010 @ [redacted]w[redacted]d, admitted for  Pregnancy, Labor Management.   Subjective:  Comfortable with epidural  Objective:  BP (!) 120/97 (BP Location: Right Arm)   Pulse (!) 176   Temp 98.2 F (36.8 C) (Oral)   Resp 18   Ht '5\' 9"'$  (1.753 m)   Wt 119.7 kg   LMP 01/15/2022 (Exact Date)   SpO2 100%   BMI 38.99 kg/m  Abd: gravid, ND, FHT present, mild tenderness on exam Extr: trace to 1+ bilateral pedal edema SVE: CERVIX: 9 cm dilated, 90 effaced, 0 station  EFM: FHR: 150 bpm, variability: moderate,  accelerations:  Present,  decelerations:  Absent Toco: Frequency: Every 2-4 minutes Labs: I have reviewed the patient's lab results.   Assessment & Plan:  G2P0010 @ 24w6dadmitted for  Pregnancy and Labor/Delivery Management  1. Pain management: epidural. 2. FWB: FHT category I.  3. ID: GBS negative 4. Labor management: consider pitocin if contractions space out, anticipate vaginal delivery  All discussed with patient, see orders   JaRod CanCNFlowellaroup 11/01/2022  8:03 PM

## 2022-11-01 NOTE — Progress Notes (Signed)
Patient ID: Dana Murphy, female   DOB: 06-27-2001, 22 y.o.   MRN: [id number] Assuming care for this pt . I have gotten an update from [place] Currently decreased variability on external monitoring . Thick mec . I have instructed her to place a FSE to get a better assessment of the baby .

## 2022-11-01 NOTE — H&P (Signed)
OB History & Physical   History of Present Illness:  Chief Complaint: contractions  HPI:  Dana Murphy is a 22 y.o. G2P0010 female at 59w6ddated by 7 week ultrasound.  Her pregnancy has been complicated by thyroid nodule, enlarged thyroid, obesity, abnormal finding on fetal ultrasound- hypoplastic nasal bone- resolved, choroid plexus cyst, diet controlled GDM .    She reports contractions.   She denies leakage of fluid.   She denies vaginal bleeding.   She reports fetal movement.    She was recently diagnosed with Covid and is currently afebrile and has some congestion. Otherwise she feels well.  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      Supervision of high risk pregnancy, antepartum 02/21/2022 by JCleophas Dunker CMA No   Overview Addendum 08/31/2022  3:15 PM by CRubie Maid MD     Nursing Staff Provider  Office Location  [facility] Dating  LMP/874w2d     Language  English Anatomy [place], hypoplastic nasal bone  Flu Vaccine  Declines Genetic/Carrier Screen  NIPS: drawn 06/07/22, nml AFP:    Horizon:  TDaP Vaccine   08/31/2022 Hgb A1C or  GTT Early  Third trimester: 156  COVID Vaccine    LAB RESULTS   Rhogam  N/A Blood Type A/Positive/-- (06/12 1033)   Baby Feeding Plan  breast Antibody Negative (06/12 1033)  Contraception  Rubella 2.19 (06/12 1033)  Circumcision  RPR Non Reactive (06/12 1033)   Pediatrician   HBsAg Negative (06/12 1033)   Support Person  FOB HCVAb   Prenatal Classes  HIV Non Reactive (06/12 1033)     BTL Consent  GBS   (For PCN allergy, check sensitivities)   VBAC  Consent  Pap        DME Rx '[ ]'$  BP cuff '[ ]'$  Weight Scale Waterbirth  '[ ]'$  Class '[ ]'$  Consent '[ ]'$  CNM visit  PHQ9 & GAD7 [  ] new OB [  ] 28 weeks  [  ] 36 weeks Induction  '[ ]'$  Orders Entered '[ ]'$ Foley Y/N             Maternal Medical History:   Past Medical History:  Diagnosis Date   Gestational diabetes    No pertinent past medical history     Past Surgical History:  Procedure Laterality Date   NO PAST SURGERIES      No Known Allergies  Prior to Admission medications   Medication Sig Start Date End Date Taking? Authorizing Provider  Accu-Chek Softclix Lancets lancets Use as instructed 09/10/22   GlRod CanCNM  Blood Glucose Monitoring Suppl (ACCU-CHEK AVIVA PLUS) w/Device KIT Check blood sugar 4 times daily; fasting and 2 hours after each meal 09/10/22   GlRod CanCNM  calcium carbonate (TUMS - DOSED IN MG ELEMENTAL CALCIUM) 500 MG chewable tablet Chew 1 tablet by mouth daily.    [provider]  glucose blood (ACCU-CHEK AVIVA PLUS) test strip Use as instructed 09/10/22   GlRod CanCNM  Prenatal Multivit-Min-Fe-FA (PRE-NATAL PO) Take 1 tablet by  mouth daily.    [provider]    OB History  Gravida Para Term Preterm AB Living  2 0 0 0 1 0  SAB IAB Ectopic Multiple Live Births  0 0 0 0 0    # Outcome Date GA Lbr Len/2nd Weight Sex Delivery Anes PTL Lv  2 Current           1 AB 07/21/21 [redacted]w[redacted]d   SAB       Prenatal care site: [facility]  Social History: She  reports that she quit smoking about 8 months ago. Her smoking use included cigarettes. She has a 5.00 pack-year smoking history. She has never used smokeless tobacco. She reports that she does not drink alcohol and does not use drugs.  Family History: family history includes Diabetes in her father and mother; Hypertension in her father and mother; Miscarriages / SKoreain her mother.    Review of Systems:  Review of Systems  Constitutional:  Negative for chills and fever.   HENT:  Positive for congestion. Negative for ear discharge, ear pain, hearing loss, sinus pain and sore throat.   Eyes:  Negative for blurred vision and double vision.  Respiratory:  Negative for cough, shortness of breath and wheezing.   Cardiovascular:  Negative for chest pain, palpitations and leg swelling.  Gastrointestinal:  Positive for abdominal pain. Negative for blood in stool, constipation, diarrhea, heartburn, melena, nausea and vomiting.  Genitourinary:  Negative for dysuria, flank pain, frequency, hematuria and urgency.  Musculoskeletal:  Negative for back pain, joint pain and myalgias.  Skin:  Negative for itching and rash.  Neurological:  Negative for dizziness, tingling, tremors, sensory change, speech change, focal weakness, seizures, loss of consciousness, weakness and headaches.  Endo/Heme/Allergies:  Negative for environmental allergies. Does not bruise/bleed easily.  Psychiatric/Behavioral:  Negative for depression, hallucinations, memory loss, substance abuse and suicidal ideas. The patient is not nervous/anxious and does not have insomnia.      Physical Exam:  BP 119/78   Pulse 84   Temp 98.3 F (36.8 C) (Oral)   Resp 17   Ht '5\' 9"'$  (1.753 m)   Wt 119.7 kg   LMP 01/15/2022 (Exact Date)   SpO2 99%   BMI 38.99 kg/m   Constitutional: Well nourished, well developed female in no acute distress.  HEENT: normal Skin: Warm and dry.  Cardiovascular: Regular rate and rhythm.   Extremity:  no edema   Respiratory: Clear to auscultation bilateral. Normal respiratory effort Abdomen: FHT present Psych: Alert and Oriented x3. No memory deficits. Normal mood and affect.    Pelvic exam: (female chaperone present) is not limited by body habitus EGBUS: within normal limits Vagina: within normal limits and with normal mucosa, scant blood on exam glove after cervical sweep Cervix: 6/100/-2   Baseline FHR: 145 beats/min   Variability: moderate/minimal at times   Accelerations: present   Decelerations: absent Contractions: present frequency: every 3-4 Overall assessment: reassuring    Lab Results  Component Value Date   SARSCOV2NAA POSITIVE (A) 10/28/2022    Assessment:  Dana PELAEZis a 22y.o. G2P0010 female at 350w6dith active labor, recent covid infection.   Plan:  Admit to Labor & Delivery  CBC, T&S, Clrs, IVF GBS negative.   Fetal well-being: Category I Anticipate vaginal delivery GDM: CBG q 4 hours Covid: Airborne precautions    JaRod CanCNM 11/01/2022 11:28 AM

## 2022-11-01 NOTE — Anesthesia Procedure Notes (Signed)
Epidural Patient location during procedure: OB Start time: 11/01/2022 9:34 AM End time: 11/01/2022 9:41 AM  Staffing Anesthesiologist: Piscitello, Precious Haws, MD Resident/CRNA: Doreen Salvage, CRNA Performed: resident/CRNA   Preanesthetic Checklist Completed: patient identified, IV checked, site marked, risks and benefits discussed, surgical consent, monitors and equipment checked, pre-op evaluation and timeout performed  Epidural Patient position: sitting Prep: ChloraPrep Patient monitoring: heart rate, continuous pulse ox and blood pressure Approach: midline Location: L3-L4 Injection technique: LOR saline  Needle:  Needle type: Tuohy  Needle gauge: 17 G Needle length: 9 cm and 9 Needle insertion depth: 9 cm Catheter type: closed end flexible Catheter size: 19 Gauge Catheter at skin depth: 15 cm Test dose: negative and 1.5% lidocaine with Epi 1:200 K  Assessment Sensory level: T10 Events: blood not aspirated, no cerebrospinal fluid, injection not painful, no injection resistance, no paresthesia and negative IV test  Additional Notes 1 attempt Pt. Evaluated and documentation done after procedure finished. Patient identified. Risks/Benefits/Options discussed with patient including but not limited to bleeding, infection, nerve damage, paralysis, failed block, incomplete pain control, headache, blood pressure changes, nausea, vomiting, reactions to medication both or allergic, itching and postpartum back pain. Confirmed with bedside nurse the patient's most recent platelet count. Confirmed with patient that they are not currently taking any anticoagulation, have any bleeding history or any family history of bleeding disorders. Patient expressed understanding and wished to proceed. All questions were answered. Sterile technique was used throughout the entire procedure. Please see nursing notes for vital signs. Test dose was given through epidural catheter and negative prior to continuing  to dose epidural or start infusion. Warning signs of high block given to the patient including shortness of breath, tingling/numbness in hands, complete motor block, or any concerning symptoms with instructions to call for help. Patient was given instructions on fall risk and not to get out of bed. All questions and concerns addressed with instructions to call with any issues or inadequate analgesia.    Patient tolerated the insertion well without immediate complications.Reason for block:procedure for pain

## 2022-11-02 ENCOUNTER — Encounter: Payer: Self-pay | Admitting: Advanced Practice Midwife

## 2022-11-02 DIAGNOSIS — O411290 Chorioamnionitis, unspecified trimester, not applicable or unspecified: Secondary | ICD-10-CM | POA: Insufficient documentation

## 2022-11-02 LAB — GLUCOSE, CAPILLARY: Glucose-Capillary: 119 mg/dL — ABNORMAL HIGH (ref 70–99)

## 2022-11-02 MED ORDER — ONDANSETRON HCL 4 MG PO TABS: 4.0000 mg | ORAL_TABLET | ORAL | Status: DC | PRN

## 2022-11-02 MED ORDER — IBUPROFEN 600 MG PO TABS
600.0000 mg | ORAL_TABLET | Freq: Four times a day (QID) | ORAL | Status: DC
  Administered 2022-11-02 – 2022-11-03 (×6): 600 mg via ORAL
  Filled 2022-11-02 (×6): qty 1

## 2022-11-02 MED ORDER — ACETAMINOPHEN 325 MG PO TABS
650.0000 mg | ORAL_TABLET | ORAL | Status: DC | PRN
  Administered 2022-11-02: 650 mg via ORAL
  Filled 2022-11-02: qty 2

## 2022-11-02 MED ORDER — DIBUCAINE (PERIANAL) 1 % EX OINT
1.0000 | TOPICAL_OINTMENT | CUTANEOUS | Status: DC | PRN
  Administered 2022-11-02: 1 via RECTAL
  Filled 2022-11-02: qty 28

## 2022-11-02 MED ORDER — BENZOCAINE-MENTHOL 20-0.5 % EX AERO
1.0000 | INHALATION_SPRAY | CUTANEOUS | Status: DC | PRN
  Administered 2022-11-02: 1 via TOPICAL
  Filled 2022-11-02: qty 56

## 2022-11-02 MED ORDER — WITCH HAZEL-GLYCERIN EX PADS
1.0000 | MEDICATED_PAD | CUTANEOUS | Status: DC | PRN
  Administered 2022-11-02: 1 via TOPICAL
  Filled 2022-11-02: qty 100

## 2022-11-02 MED ORDER — PRENATAL MULTIVITAMIN CH
1.0000 | ORAL_TABLET | Freq: Every day | ORAL | Status: DC
  Administered 2022-11-02 – 2022-11-03 (×2): 1 via ORAL
  Filled 2022-11-02 (×2): qty 1

## 2022-11-02 MED ORDER — ONDANSETRON HCL 4 MG/2ML IJ SOLN
4.0000 mg | INTRAMUSCULAR | Status: DC | PRN
  Administered 2022-11-02: 4 mg via INTRAVENOUS
  Filled 2022-11-02: qty 2

## 2022-11-02 MED ORDER — TETANUS-DIPHTH-ACELL PERTUSSIS 5-2.5-18.5 LF-MCG/0.5 IM SUSY: 0.5000 mL | PREFILLED_SYRINGE | Freq: Once | INTRAMUSCULAR | Status: DC

## 2022-11-02 MED ORDER — SIMETHICONE 80 MG PO CHEW: 80.0000 mg | CHEWABLE_TABLET | ORAL | Status: DC | PRN

## 2022-11-02 MED ORDER — SENNOSIDES-DOCUSATE SODIUM 8.6-50 MG PO TABS
2.0000 | ORAL_TABLET | Freq: Every day | ORAL | Status: DC
  Administered 2022-11-03: 2 via ORAL
  Filled 2022-11-02: qty 2

## 2022-11-02 MED ORDER — COCONUT OIL OIL
1.0000 | TOPICAL_OIL | Status: DC | PRN
  Filled 2022-11-02: qty 7.5

## 2022-11-02 MED ORDER — DIPHENHYDRAMINE HCL 25 MG PO CAPS: 25.0000 mg | ORAL_CAPSULE | Freq: Four times a day (QID) | ORAL | Status: DC | PRN

## 2022-11-02 NOTE — Progress Notes (Signed)
  Labor Progress Note   22 y.o. G2P0010 @ [redacted]w[redacted]d, admitted for  Pregnancy, Labor Management.   Subjective:  Feeling some low abdominal pain and decreased energy (epidural had been turned down to give more sense of pressure). Maternal pushing effort has been excellent.  Objective:  BP (!) 118/59   Pulse (!) 108   Temp (!) 101.2 F (38.4 C) (Axillary)   Resp 18   Ht '5\' 9"'$  (1.753 m)   Wt 119.7 kg   LMP 01/15/2022 (Exact Date)   SpO2 98%   BMI 38.99 kg/m  Abd: gravid, ND, FHT present, mild tenderness on exam Extr: trace to 1+ bilateral pedal edema SVE: CERVIX: 10 cm dilated, 100 effaced, +1 to +2 station  EFM: FHR: 165 bpm, variability: moderate,  accelerations:  Present,  decelerations:  Absent Toco: Frequency: Every 2-3 minutes Labs: I have reviewed the patient's lab results.   Assessment & Plan:  G2P0010 @ 435w0dadmitted for  Pregnancy and Labor/Delivery Management  1. Pain management: epidural. 2. FWB: FHT category I/II 3. Chorioamnionitis: antibiotics started at 11:00 PM/tylenol given.  4. ID: GBS negative 5. Labor management: labor down, increase epidural rate 6. Dr ScOuida Sillspdated by text at 11:30 PM   All discussed with patient, see orders   JaRod CanCNKootenairoup 11/02/2022  1:09 AM

## 2022-11-02 NOTE — Op Note (Signed)
NAME: Dana Murphy, [place] MEDICAL RECORD NO: [id number] ACCOUNT NO: [id number] DATE OF BIRTH: 14-Nov-2000 FACILITY: [facility] LOCATION: [facility] PHYSICIAN: Boykin Nearing, MD  Operative Report   DATE OF PROCEDURE: 11/02/2022   PREOPERATIVE DIAGNOSIS:  Arrest of descent 40+0 weeks, estimated gestational age with active labor and arrest of descent.  POSTOPERATIVE DIAGNOSIS:    1.  Arrest of descent 40+0 weeks, estimated gestational age with active labor and arrest of descent. 2.  Chorioamnionitis. 3.  Meconium staining of membranes.    PROCEDURE: Indicated low Simpson forceps delivery.  SURGEON:  Boykin Nearing, MD  INDICATIONS:  A 22 year old gravida 2, para 0, patient with active labor throughout the night with certified nurse midwife Rod Can.  The patient pushed for off and on for 4 hours and was not able to deliver the baby past +3/3 station.  The patient  was diagnosed with chorioamnionitis and was treated with gentamicin and ampicillin.  Approximately five hours prior to my evaluation.  ANESTHESIA:  Continuous lumbar epidural.    PROCEDURE: I was called by certified nurse midwife, Rod Can after mother was unable to deliver with active pushing for 4 hours.  I evaluated the patient and the fetus was estimated to be +3/3 station somewhat asynclitic in the ROA/OA position -  vertex.  Foley catheter was in place with internal FSE monitoring.  I discussed with the patient and father of the baby the role for forceps delivery given that she was unable to deliver and push the baby out on her own.  I explained the risk to them  including potential lacerations of her and bruising and laceration of the fetus.  Verbal consent was obtained.  The Simpson forceps were then brought up to the delivery table.  A Foley catheter was removed.  The FSE was attempted to be removed and I  attempted to move the FSE clip, but it did not come off of the fetal head.  The Simpson forceps were  then placed with the posterior blade initially followed by the anterior blade with good articulation. With three sets of maternal pushing the head  delivered.  Forceps were removed and shoulders and body were delivered with the hand, fetal hand coming down with the shoulders.  Vigorous female was then placed on mother's abdomen and dried for 60 seconds.  Nursery staff assigned Apgar scores of 8 and  9, fetal weight 8 pounds 10 ounces.  Cord was doubly clamped and the placenta was delivered shortly thereafter.  Good hemostasis was noted.  No cervical lacerations.  There was a second-degree perineal laceration and 2 bilateral labial lacerations both  being small.  Incisions were injected with 1% lidocaine and a digital rectal exam was performed to ensure there was no defect in the rectum and there was not.  The second-degree laceration was repaired with 2-0 and 3-0 Vicryl in the standard fashion with  good approximation of tissues.  Good hemostasis noted, perilabial lacerations required 1 interrupted suture.  Crede maneuver was performed at the end, there were no complications.  ESTIMATED BLOOD LOSS: 300 mL.  The patient tolerated the procedure well, fetal markings with forceps there was a slight marking on the left occipital orbit with no skin laceration and no edema and no ecchymosis.      Elián.Darby D: 11/02/2022 6:47:00 am T: 11/02/2022 7:36:00 am  JOB: [id number]/ [id number]

## 2022-11-02 NOTE — Discharge Summary (Signed)
OB Discharge Summary     Patient Name: Dana Murphy DOB: 2000/12/28 MRN: [id number]  Date of admission: 11/01/2022 Delivering MD: Wilson Singer Date of Delivery: 11/02/2022  Date of discharge: 11/02/2022  Admitting diagnosis: Labor and delivery, indication for care [O75.9] Intrauterine pregnancy: [redacted]w[redacted]d    Secondary diagnosis: Gestational Diabetes diet controlled (A1)     Discharge diagnosis: Term Pregnancy Delivered and GDM A1                                                                                                Post partum procedures:{Postpartum procedures:23558}  Augmentation: Pitocin  Complications: Intrauterine Inflammation or infection (Chorioamniotis), [facility]  Onset of Labor With Vaginal Delivery      22y.o. yo G2P0010 at 470w0das admitted in Active Labor on 11/01/2022. Labor course was complicated by maternal covid infection, chorioamnionitis, prolonged second stage, vacuum assisted (Dr ScOuida Sillsvaginal delivery  Membrane Rupture Time/Date: 2:30 PM ,11/01/2022   Delivery Method:Vaginal, Forceps  Episiotomy: None  Lacerations:  2nd degree;Perineal;Labial  See delivery note for details  Patient had a postpartum course complicated by ***.  She is ambulating, tolerating a regular diet, passing flatus, and urinating well.   Patient is discharged home in stable condition on 11/02/22.  Newborn Data: Birth date:11/02/2022  Birth time:5:50 AM  Gender:Female  Living status:Living  Apgars:8 ,9  Weight:   Physical exam  Vitals:   11/02/22 0439 11/02/22 0444 11/02/22 0449 11/02/22 0454  BP:      Pulse:      Resp:      Temp:      TempSrc:      SpO2: 100% 99% 96% 97%  Weight:      Height:       General: {Exam; general:21111117} Lochia: {Desc; appropriate/inappropriate:30686::"appropriate"} Uterine Fundus: {Desc; firm/soft:30687} Incision: N/A DVT Evaluation: {Exam; dvt:2111122}  Labs: Lab Results  Component Value Date   WBC 11.7  (H) 11/01/2022   HGB 12.7 11/01/2022   HCT 37.0 11/01/2022   MCV 86.0 11/01/2022   PLT 199 11/01/2022    Discharge instruction: per After Visit Summary.  Medications:  Allergies as of 11/02/2022   No Known Allergies   Med Rec must be completed prior to using this SMEast [place]*       Diet: carb modified diet  Activity: Advance as tolerated. Pelvic rest for 6 weeks.   Outpatient follow up:  Follow-up Information     GlRod CanCNM. Schedule an appointment as soon as possible for a visit in 2 week(s).   Specialty: Obstetrics Why: 2 week telephone visit and 6 week in office postpartum visits Contact information: [id number] [phone]                 Postpartum contraception: {Contraceptives:21111124} Rhogam Given postpartum: Rh positive Rubella vaccine given postpartum: immune Varicella vaccine given postpartum: non-immune TDaP given antepartum or postpartum: given antepartum    Newborn Delivery   Birth date/time: 11/02/2022 05:50:39 Delivery type: Vaginal, Forceps       Baby Feeding: Breast  Disposition:{CHL IP OB HOME WITH  KQASUO:[id number]}  SIGNED:  Rod Can, CNM 11/02/2022 6:26 AM

## 2022-11-02 NOTE — Lactation Note (Signed)
This note was copied from a baby's chart. Lactation Consultation Note  Patient Name: Dana Murphy Date: 11/02/2022 Reason for consult: L&D Initial assessment;Initial assessment;Primapara;Term;Other (Comment) (mom is COVID+) Age:23 hours  Maternal Data Has patient been taught Hand Expression?: Yes Does the patient have breastfeeding experience prior to this delivery?: No  P1, SVD 9 hours ago. Mom is Covid + with isolation precautions implemented.  Mom desires breastfeeding. At this time Transition RN has provided majority of BF support, and requested support for this feeding due to latching difficulties.  Feeding Mother's Current Feeding Choice: Breast Milk  Baby was positioned in cradle hold at the breast with mom sandwiching/c-hold the breast tissue upon entry. Baby had a rhythmic suck pattern, audible swallows heard, and mom in no pain/discomfort.  LC and parents talked about how to maximize positioning and alignment for good deep latch, how to use cross-cradle hold for more head/neck support of baby as she is learning to find the breast and lacks head control. Discussed with sandwich hold how to slowly release the tissue once the baby has a strong rhythmic pattern established.  Provided guidance on how to calm baby at the breast with skin to skin if baby is getting agitated to help "re group" before trying to latch again. Encouraged hand expression as a way to encourage sustaining latch.  LATCH Score Latch:  (baby already latched upon entry)  Baby remained at the breast throughout entire visit; active, swallows, no questions/discomfort per mom.   Lactation Tools Discussed/Used    Interventions Interventions: Breast feeding basics reviewed;Hand express;Breast compression;Coconut oil;Education (Feeding patterns/behaviors in first 24 hours, early feeding cues, position/latch, c-hold/sandwiching of tissue with slow release, output expectations)  Education given- see  above.  Discharge    Consult Status Consult Status: Follow-up  Call as needed; will follow-up on routine rounds unless needed before.  Lavonia Drafts 11/02/2022, 3:03 PM

## 2022-11-03 DIAGNOSIS — O98513 Other viral diseases complicating pregnancy, third trimester: Secondary | ICD-10-CM

## 2022-11-03 DIAGNOSIS — U071 COVID-19: Secondary | ICD-10-CM

## 2022-11-03 DIAGNOSIS — Z3A39 39 weeks gestation of pregnancy: Secondary | ICD-10-CM

## 2022-11-03 DIAGNOSIS — O411030 Infection of amniotic sac and membranes, unspecified, third trimester, not applicable or unspecified: Secondary | ICD-10-CM

## 2022-11-03 DIAGNOSIS — O24410 Gestational diabetes mellitus in pregnancy, diet controlled: Secondary | ICD-10-CM

## 2022-11-03 LAB — CBC
HCT: 27.1 % — ABNORMAL LOW (ref 36.0–46.0)
Hemoglobin: 9 g/dL — ABNORMAL LOW (ref 12.0–15.0)
MCH: 29.2 pg (ref 26.0–34.0)
MCHC: 33.2 g/dL (ref 30.0–36.0)
MCV: 88 fL (ref 80.0–100.0)
Platelets: 157 10*3/uL (ref 150–400)
RBC: 3.08 MIL/uL — ABNORMAL LOW (ref 3.87–5.11)
RDW: 13.2 % (ref 11.5–15.5)
WBC: 14.3 10*3/uL — ABNORMAL HIGH (ref 4.0–10.5)
nRBC: 0 % (ref 0.0–0.2)

## 2022-11-03 LAB — GLUCOSE, CAPILLARY: Glucose-Capillary: 73 mg/dL (ref 70–99)

## 2022-11-03 MED ORDER — VARICELLA VIRUS VACCINE LIVE 1350 PFU/0.5ML IJ SUSR
0.5000 mL | Freq: Once | INTRAMUSCULAR | Status: AC
  Administered 2022-11-03: 0.5 mL via SUBCUTANEOUS
  Filled 2022-11-03: qty 0.5

## 2022-11-03 MED ORDER — FERROUS SULFATE 325 (65 FE) MG PO TABS: 325.0000 mg | ORAL_TABLET | Freq: Every day | ORAL | 3 refills | Status: DC

## 2022-11-03 NOTE — Anesthesia Postprocedure Evaluation (Signed)
Anesthesia Post Note  Patient: Dana Murphy  Procedure(s) Performed: AN AD HOC LABOR EPIDURAL  Patient location during evaluation: Mother Baby Anesthesia Type: Epidural Level of consciousness: awake and alert Pain management: pain level controlled Vital Signs Assessment: post-procedure vital signs reviewed and stable Respiratory status: spontaneous breathing, nonlabored ventilation and respiratory function stable Cardiovascular status: stable Postop Assessment: no headache, no backache and epidural receding Anesthetic complications: no  No notable events documented.   Last Vitals:  Vitals:   11/02/22 2011 11/02/22 2355  BP: 131/81 116/67  Pulse: 94 83  Resp: 20 18  Temp: 37.1 C 36.9 C  SpO2: 99% 98%    Last Pain:  Vitals:   11/02/22 2355  TempSrc: Oral  PainSc:                  Estill Batten

## 2022-11-03 NOTE — Final Progress Note (Signed)
Post Partum Day 1 Subjective: Dana Murphy is feeling well overall. She is ambulating, voiding, and tolerating POs without difficulty. Her pain is well-controlled and her bleeding is WNL. Her mood is stable. Breastfeeding is going well. She is a little congested from COVID but afebrile.  Objective: Blood pressure 105/82, pulse 86, temperature 97.6 F (36.4 C), temperature source Oral, resp. rate 18, height '5\' 9"'$  (1.753 m), weight 119.7 kg, last menstrual period 01/15/2022, SpO2 97 %, unknown if currently breastfeeding.  Physical Exam:  General: alert, cooperative, and appears stated age 22: appropriate Uterine Fundus: firm Laceration: healing well DVT Evaluation: No evidence of DVT seen on physical exam.  Recent Labs    11/01/22 0852 11/03/22 0418  HGB 12.7 9.0*  HCT 37.0 27.1*    Assessment/Plan: Discharge home Discharge instructions reviewed NFP for contraception Video visit in 2 weeks, PP OV in 6 weeks   LOS: 2 days   Lurlean Horns, CNM 11/03/2022, 11:05 AM

## 2022-11-03 NOTE — Progress Notes (Signed)
Pt discharged with infant.  Discharge instructions, prescriptions and follow up appointment given to and reviewed with pt. Pt verbalized understanding. Escorted out by auxillary. 

## 2022-11-22 ENCOUNTER — Telehealth (INDEPENDENT_AMBULATORY_CARE_PROVIDER_SITE_OTHER): Payer: BC Managed Care – PPO | Admitting: Advanced Practice Midwife

## 2022-11-22 ENCOUNTER — Encounter: Payer: Self-pay | Admitting: Advanced Practice Midwife

## 2022-11-22 NOTE — Progress Notes (Signed)
Patient ID: Dana Murphy, female   DOB: 07/03/2001, 22 y.o.   MRN: [id number]  Reason for Visit: Postpartum Care   Subjective:  HPI:  Dana Murphy is a 22 y.o. female routine 3 week postpartum visit. She reports she is doing well. She continues to breastfeed and is supplementing as she reports "not making as much as she would like to". Her bleeding has stopped in the past couple days. No bowel, bladder concerns. She is getting about 6 hours of sleep in 24 hours and has a good support system. She reports moods are good. She plans condoms for birth control.  Past Medical History:  Diagnosis Date   Gestational diabetes    No pertinent past medical history    Family History  Problem Relation Age of Onset   Miscarriages / [place] Mother        had two second trimester losses (> 20wk)   Diabetes Mother    Hypertension Mother    Diabetes Father    Hypertension Father    Cancer Neg Hx    Thyroid disease Neg Hx    Hyperlipidemia Neg Hx    Past Surgical History:  Procedure Laterality Date   NO PAST SURGERIES      Short Social History:  Social History   Tobacco Use   Smoking status: Former    Packs/day: 0.50    Years: 10.00    Total pack years: 5.00    Types: Cigarettes    Quit date: 02/18/2022    Years since quitting: 0.7   Smokeless tobacco: Never   Tobacco comments:    6-7qd; 02/21/22 - Is day 3 of no smoking.  Substance Use Topics   Alcohol use: No    No Known Allergies  Current Outpatient Medications  Medication Sig Dispense Refill   calcium carbonate (TUMS - DOSED IN MG ELEMENTAL CALCIUM) 500 MG chewable tablet Chew 1 tablet by mouth daily.     ferrous sulfate 325 (65 FE) MG tablet Take 1 tablet (325 mg total) by mouth daily. 30 tablet 3   Prenatal Multivit-Min-Fe-FA (PRE-NATAL PO) Take 1 tablet by mouth daily.     No current facility-administered medications for this visit.   Review of Systems  Constitutional:  Negative for chills and fever.  HENT:   Negative for congestion, ear discharge, ear pain, hearing loss, sinus pain and sore throat.   Eyes:  Negative for blurred vision and double vision.  Respiratory:  Negative for cough, shortness of breath and wheezing.   Cardiovascular:  Negative for chest pain, palpitations and leg swelling.  Gastrointestinal:  Negative for abdominal pain, blood in stool, constipation, diarrhea, heartburn, melena, nausea and vomiting.  Genitourinary:  Negative for dysuria, flank pain, frequency, hematuria and urgency.  Musculoskeletal:  Negative for back pain, joint pain and myalgias.  Skin:  Negative for itching and rash.  Neurological:  Negative for dizziness, tingling, tremors, sensory change, speech change, focal weakness, seizures, loss of consciousness, weakness and headaches.  Endo/Heme/Allergies:  Negative for environmental allergies. Does not bruise/bleed easily.  Psychiatric/Behavioral:  Negative for depression, hallucinations, memory loss, substance abuse and suicidal ideas. The patient is not nervous/anxious and does not have insomnia.         Objective:  Objective   Vitals:   11/22/22 1049  Weight: 230 lb (104.3 kg)  Height: 5' 9"$  (1.753 m)   Body mass index is 33.97 kg/m. No physical exam: telephone visit  Data:  Edinburgh Postnatal Depression Scale - 11/22/22 1050  Edinburgh Postnatal Depression Scale:  In the Past 7 Days   I have been able to laugh and see the funny side of things. 0    I have looked forward with enjoyment to things. 0    I have blamed myself unnecessarily when things went wrong. 2    I have been anxious or worried for no good reason. 1    I have felt scared or panicky for no good reason. 1    Things have been getting on top of me. 1    I have been so unhappy that I have had difficulty sleeping. 0    I have felt sad or miserable. 0    I have been so unhappy that I have been crying. 0    The thought of harming myself has occurred to me. 0    Edinburgh  Postnatal Depression Scale Total 5                 Assessment/Plan:     2 week postpartum visit, no problems     [facility] 11/22/2022, 11:24 AM    Subjective   Virtual Visit via Telephone Note  I connected with Dana Murphy on 11/22/22 at 11:10 AM EST by telephone and verified that I am speaking with the correct person using two identifiers.   I discussed the limitations, risks, security and privacy concerns of performing an evaluation and management service by telephone and the availability of in person appointments. I also discussed with the patient that there may be a patient responsible charge related to this service. The patient expressed understanding and agreed to proceed.  The patient was at home I spoke with the patient from my  office phone The names of people involved in this encounter were: Dana Murphy Buddy Duty, and Rod Can, CNM   Follow Up Instructions:    I discussed the assessment and treatment plan with the patient. The patient was provided an opportunity to ask questions and all were answered. The patient agreed with the plan and demonstrated an understanding of the instructions.   The patient was advised to call back or seek an in-person evaluation if the symptoms worsen or if the condition fails to improve as anticipated.  I provided 10 minutes of non-face-to-face time during this encounter.  Return in about 3 weeks (around 12/13/2022) for 6 weeks postpartum visit.

## 2022-12-19 ENCOUNTER — Other Ambulatory Visit (HOSPITAL_COMMUNITY)
Admission: RE | Admit: 2022-12-19 | Discharge: 2022-12-19 | Disposition: A | Discharge status: Home / Self Care | Payer: BC Managed Care – PPO | Source: Ambulatory Visit | Attending: Advanced Practice Midwife | Admitting: Advanced Practice Midwife

## 2022-12-19 ENCOUNTER — Ambulatory Visit (INDEPENDENT_AMBULATORY_CARE_PROVIDER_SITE_OTHER): Payer: BC Managed Care – PPO | Admitting: Advanced Practice Midwife

## 2022-12-19 ENCOUNTER — Encounter: Payer: Self-pay | Admitting: Advanced Practice Midwife

## 2022-12-19 DIAGNOSIS — Z124 Encounter for screening for malignant neoplasm of cervix: Secondary | ICD-10-CM | POA: Insufficient documentation

## 2022-12-19 DIAGNOSIS — Z30011 Encounter for initial prescription of contraceptive pills: Secondary | ICD-10-CM

## 2022-12-19 MED ORDER — NORETHINDRONE 0.35 MG PO TABS: 1.0000 | ORAL_TABLET | Freq: Every day | ORAL | 11 refills | Status: DC

## 2022-12-19 NOTE — Progress Notes (Signed)
[facility]  Postpartum Visit  Chief Complaint:  Chief Complaint  Patient presents with   Postpartum Care    6 week PP    History of Present Illness: Patient is a 22 y.o. G2P1011 presents for postpartum visit.   Date of delivery: 11/02/2022 Type of delivery: Vaginal delivery - forceps assisted  yes Episiotomy No.  Laceration: yes, 2nd degree tear with small bilateral labial lacerations Pregnancy or labor problems:  yes, patient had Covid affecting the pregnancy; labor wore the patient out and forceps were needed to complete the delivery Any problems since the delivery:  no  Newborn Details:  SINGLETON :  1. BabyGender female. Birth weight: 3920g  Maternal Details:  Breast and formula feeding with pumping Intercourse: Yes  Contraception after delivery: Yes , condoms for now but wants BC pill prescribed today Any bowel or bladder issues: No , notes mild burning upon urination associated with perineal tear Post partum depression/anxiety noted:  no Edinburgh Post-Partum Depression Score: 5 Date of last PAP: 12/19/22  Review of Systems: Review of Systems  Constitutional:  Negative for chills and fever.  HENT:  Negative for congestion, ear discharge, ear pain, hearing loss, sinus pain and sore throat.   Eyes:  Negative for blurred vision and double vision.  Respiratory:  Negative for cough, shortness of breath and wheezing.   Cardiovascular:  Negative for chest pain, palpitations and leg swelling.  Gastrointestinal:  Negative for abdominal pain, blood in stool, constipation, diarrhea, heartburn, melena, nausea and vomiting.  Genitourinary:  Negative for dysuria, flank pain, frequency, hematuria and urgency.  Musculoskeletal:  Negative for back pain, joint pain and myalgias.  Skin:  Negative for itching and rash.  Neurological:  Negative for dizziness, tingling, tremors, sensory change, speech change, focal weakness, seizures, loss of consciousness, weakness and headaches.   Endo/Heme/Allergies:  Negative for environmental allergies. Does not bruise/bleed easily.  Psychiatric/Behavioral:  Negative for depression, hallucinations, memory loss, substance abuse and suicidal ideas. The patient is not nervous/anxious and does not have insomnia.     The following portions of the patient's history were reviewed and updated as appropriate: allergies, current medications, past family history, past medical history, past social history, past surgical history, and problem list.  Past Medical History:  Past Medical History:  Diagnosis Date   Gestational diabetes    No pertinent past medical history     Past Surgical History:  Past Surgical History:  Procedure Laterality Date   NO PAST SURGERIES      Family History:  Family History  Problem Relation Age of Onset   45 / [place] Mother        had two second trimester losses (> 20wk)   Diabetes Mother    Hypertension Mother    Diabetes Father    Hypertension Father    Cancer Neg Hx    Thyroid disease Neg Hx    Hyperlipidemia Neg Hx     Social History:  Social History   Socioeconomic History   Marital status: Single    Spouse name: Not on file   Number of children: 0   Years of education: 12   Highest education level: Not on file  Occupational History   Occupation: Marketing executive  Tobacco Use   Smoking status: Former    Packs/day: 0.50    Years: 10.00    Additional pack years: 0.00    Total pack years: 5.00    Types: Cigarettes    Quit date: 02/18/2022    Years  since quitting: 0.8   Smokeless tobacco: Never   Tobacco comments:    6-7qd; 02/21/22 - Is day 3 of no smoking.  Vaping Use   Vaping Use: Former  Substance and Sexual Activity   Alcohol use: No   Drug use: No   Sexual activity: Yes    Birth control/protection: None  Other Topics Concern   Not on file  Social History Narrative   Not on file   Social Determinants of Health   Financial Resource Strain: Low Risk   (02/21/2022)   Overall Financial Resource Strain (CARDIA)    Difficulty of Paying Living Expenses: Not very hard  Food Insecurity: No Food Insecurity (11/01/2022)   Hunger Vital Sign    Worried About Running Out of Food in the Last Year: Never true    Ran Out of Food in the Last Year: Never true  Transportation Needs: No Transportation Needs (11/01/2022)   PRAPARE - Hydrologist (Medical): No    Lack of Transportation (Non-Medical): No  Physical Activity: Insufficiently Active (02/21/2022)   Exercise Vital Sign    Days of Exercise per Week: 7 days    Minutes of Exercise per Session: 10 min  Stress: Stress Concern Present (02/21/2022)   [place]    Feeling of Stress : To some extent  Social Connections: Moderately Isolated (02/21/2022)   Social Connection and Isolation Panel [NHANES]    Frequency of Communication with Friends and Family: More than three times a week    Frequency of Social Gatherings with Friends and Family: More than three times a week    Attends Religious Services: Never    Marine scientist or Organizations: No    Attends Archivist Meetings: Never    Marital Status: Living with partner  Intimate Partner Violence: Not At Risk (02/21/2022)   Humiliation, Afraid, Rape, and Kick questionnaire    Fear of Current or Ex-Partner: No    Emotionally Abused: No    Physically Abused: No    Sexually Abused: No    Allergies:  No Known Allergies  Medications: Prior to Admission medications   Medication Sig Start Date End Date Taking? Authorizing Provider  calcium carbonate (TUMS - DOSED IN MG ELEMENTAL CALCIUM) 500 MG chewable tablet Chew 1 tablet by mouth daily.   Yes [provider]  ferrous sulfate 325 (65 FE) MG tablet Take 1 tablet (325 mg total) by mouth daily. 11/03/22 11/03/23 Yes Swanson, Ian Bushman, CNM  norethindrone (MICRONOR) 0.35 MG tablet Take 1  tablet (0.35 mg total) by mouth daily. 12/19/22  Yes Rod Can, CNM  Prenatal Multivit-Min-Fe-FA (PRE-NATAL PO) Take 1 tablet by mouth daily.   Yes [provider]    Physical Exam Blood pressure 106/67, pulse 91, weight 234 lb 1.6 oz (106.2 kg), currently breastfeeding.    General: NAD HEENT: normocephalic, anicteric Pulmonary: No increased work of breathing Abdomen: NABS, soft, non-tender, non-distended.  Umbilicus without lesions.  No hepatomegaly, splenomegaly or masses palpable. No evidence of hernia. Genitourinary:  External: Normal external female genitalia.  Normal urethral meatus, normal Bartholin's and Skene's glands.    Vagina: Normal vaginal mucosa, no evidence of prolapse.    Cervix: Grossly normal in appearance, no bleeding  Uterus: Non-enlarged, mobile, normal contour.  No CMT  Adnexa: ovaries non-enlarged, no adnexal masses  Rectal: deferred Extremities: no edema, erythema, or tenderness Neurologic: Grossly intact Psychiatric: mood appropriate, affect full   [place]  Postnatal Depression Scale - 12/19/22 1526       Edinburgh Postnatal Depression Scale:  In the Past 7 Days   I have been able to laugh and see the funny side of things. 0    I have looked forward with enjoyment to things. 0    I have blamed myself unnecessarily when things went wrong. 1    I have been anxious or worried for no good reason. 1    I have felt scared or panicky for no good reason. 1    Things have been getting on top of me. 1    I have been so unhappy that I have had difficulty sleeping. 0    I have felt sad or miserable. 0    I have been so unhappy that I have been crying. 1    The thought of harming myself has occurred to me. 0    Edinburgh Postnatal Depression Scale Total 5             Assessment: 22 y.o. [id number] presenting for 6 week postpartum visit  Plan: Problem List Items Addressed This Visit   None Visit Diagnoses     6 weeks postpartum follow-up    -   Primary   Relevant Medications   norethindrone (MICRONOR) 0.35 MG tablet   Other Relevant Orders   Cytology - PAP   Screening for cervical cancer       Relevant Orders   Cytology - PAP   Encounter for initial prescription of contraceptive pills       Relevant Medications   norethindrone (MICRONOR) 0.35 MG tablet        1) Contraception - Education given regarding options for contraception, as well as compatibility with breast feeding if applicable.  Patient plans on condoms and oral progesterone-only contraceptive for contraception.  2)  Pap - ASCCP guidelines and rationale discussed.  Patient opts for every 3 years screening interval  3) Patient underwent screening for postpartum depression with no signs of depression  4) Return in about 1 year (around 12/19/2023) for annual established gyn.  Patient's vaginal bleeding has ceased; she is unsure of the exact date. Patient has not had her period yet; patient is newly returning to sexual activity postpartum. Using condoms for now, but Micronor has been prescribed today. Patient educated on proper method of taking the pill at the same time each day for maximum efficacy. Perineal and labial lacerations healing well upon exam; pelvic exam and PAP smear performed today which the patient tolerated well. Patient states that her mood has been good and that she has not had any baby blues, just adjusting to new life with baby. She is coping well with a good support system of the father of the baby and his grandparents.   Patient seen today by CNM and Santiago Glad.  Christean Leaf, CNM [facility] 12/19/22, 5:08 PM

## 2022-12-21 LAB — CYTOLOGY - PAP: Diagnosis: NEGATIVE

## 2023-01-02 ENCOUNTER — Telehealth: Payer: Self-pay

## 2023-01-02 NOTE — Telephone Encounter (Signed)
[facility]- Discharge Call Backs-Spoke with pt on the phone about the folllowing. 1-Do you have any questions or concerns about yourself as you heal? No 2-Any concerns or questions about your baby? No 3-How was your stay at the hospital?Great 5-Did your care team work together to care for you?Yes You should be receiving a survey in the mail soon.   We would really appreciate it if you could fill that out for [place] and return it in the mail.  We value the feedback to make improvements and continue the great work we do.   If you have any questions please feel free to call me back at [phone]

## 2023-03-03 IMAGING — US US OB < 14 WEEKS - US OB TV
1 series · 13 of 28 positions shown · non-contrast
Comparison: None.

CLINICAL DATA: Vaginal bleeding for 4 days. Estimated gestational
age by LMP is 7 weeks 6 days. Quantitative beta HCG is 1,895

EXAM:
OBSTETRIC <14 WK US AND TRANSVAGINAL OB US
TECHNIQUE: Both transabdominal and transvaginal ultrasound examinations were
performed for complete evaluation of the gestation as well as the
maternal uterus, adnexal regions, and pelvic cul-de-sac.
Transvaginal technique was performed to assess early pregnancy.

[Series 1: us ob comp less 14 wks · 13 of 81 slices shown]
[im 3/81]
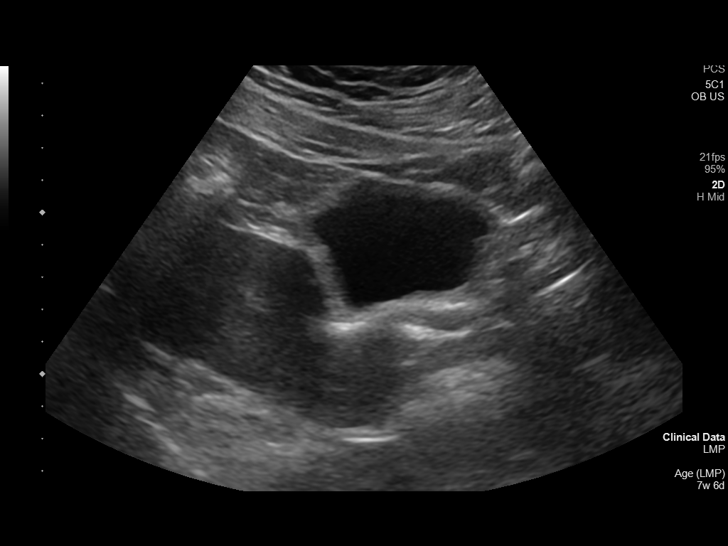
[im 9/81]
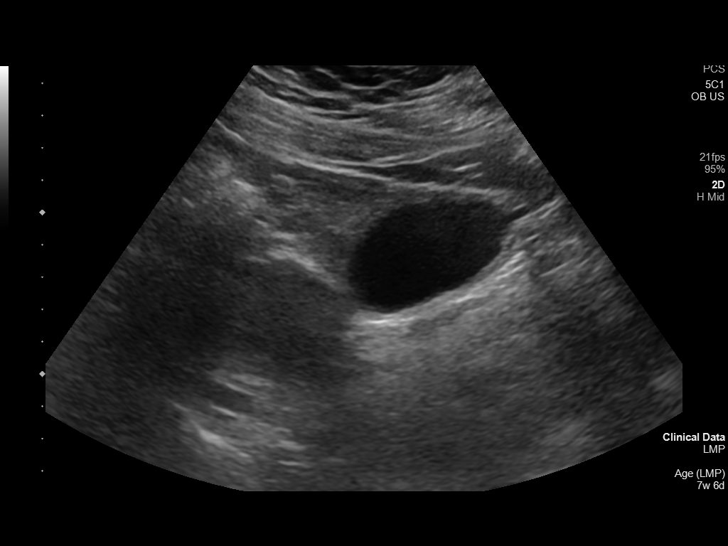
[im 15/81]
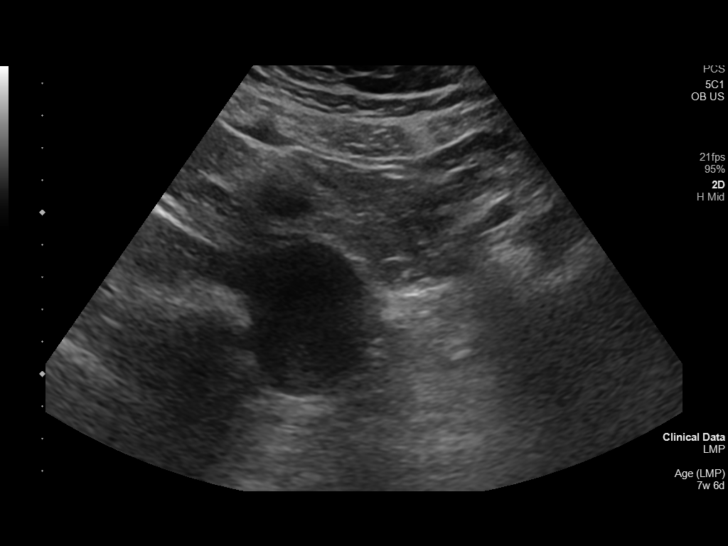
[im 21/81]
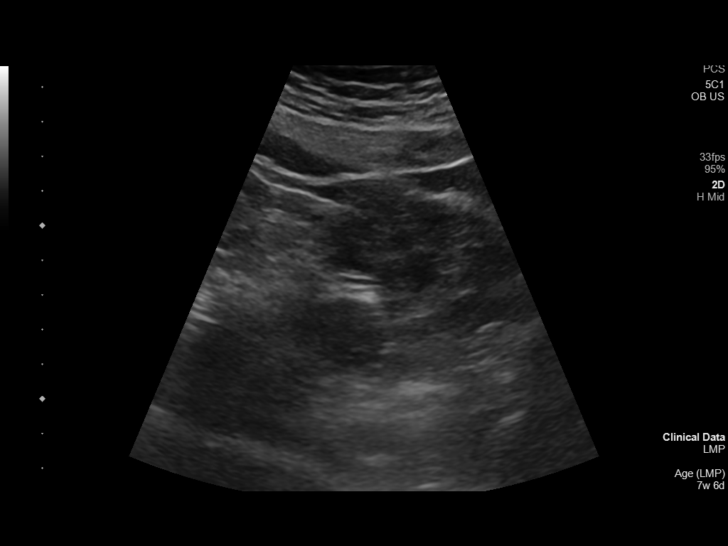
[im 27/81]
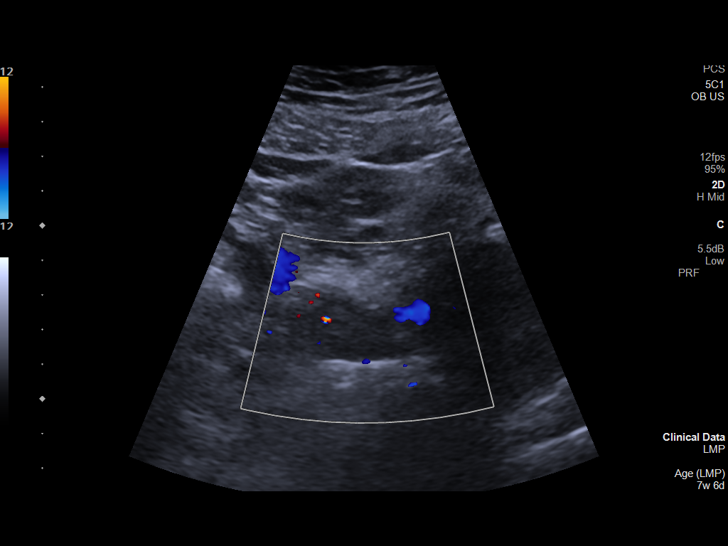
[im 33/81]
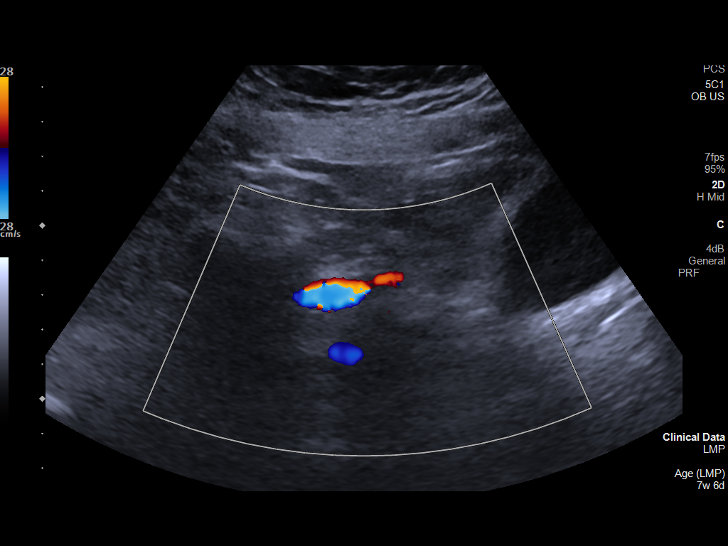
[im 42/81]
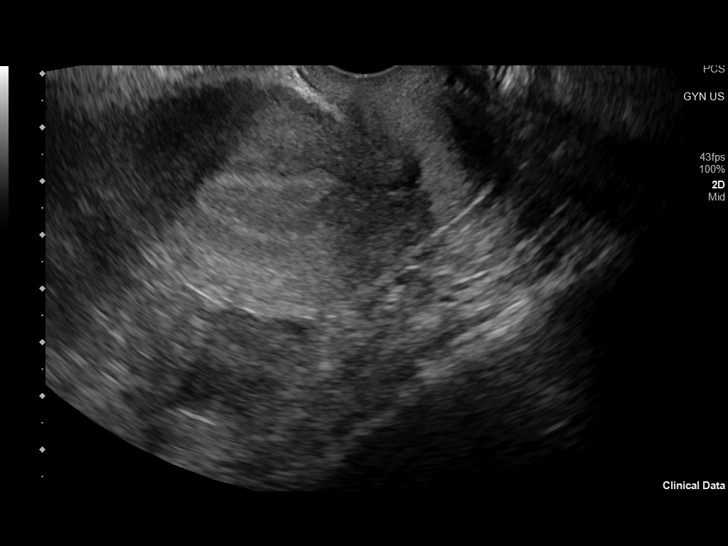
[im 48/81]
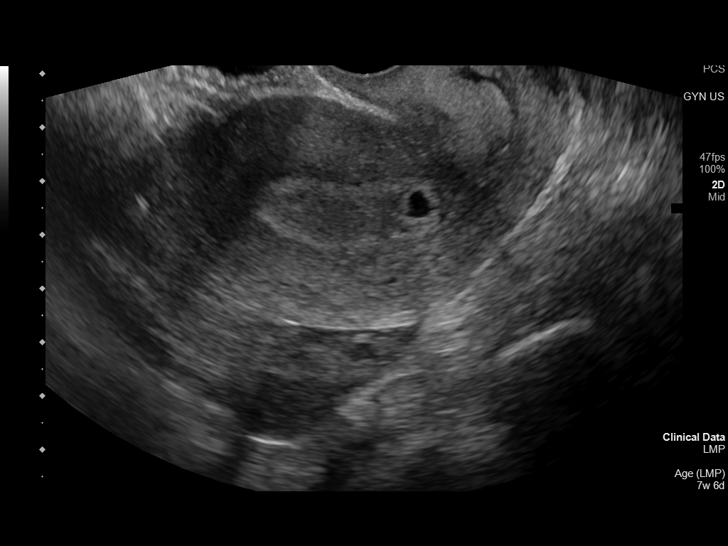
[im 54/81]
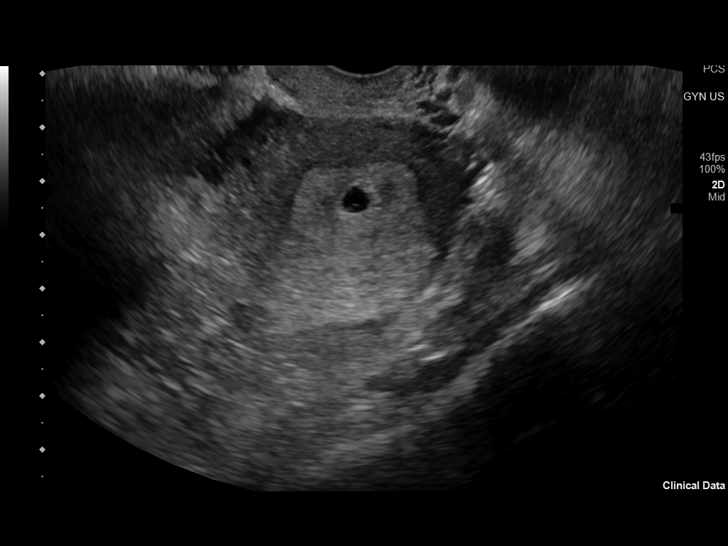
[im 60/81]
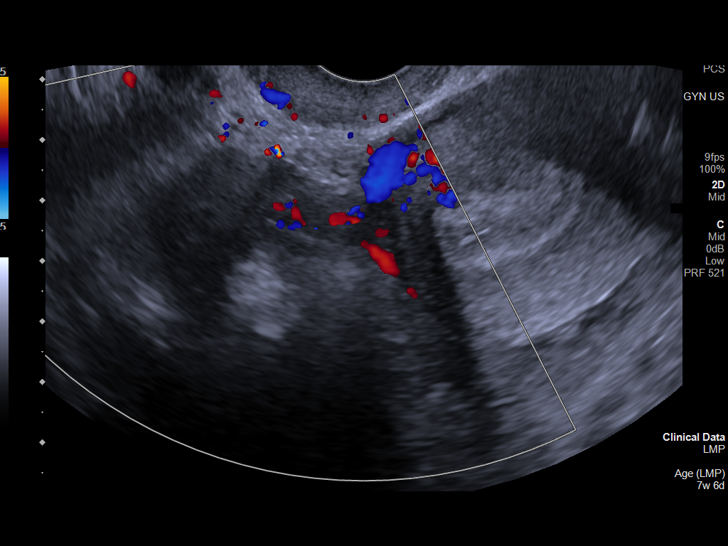
[im 66/81]
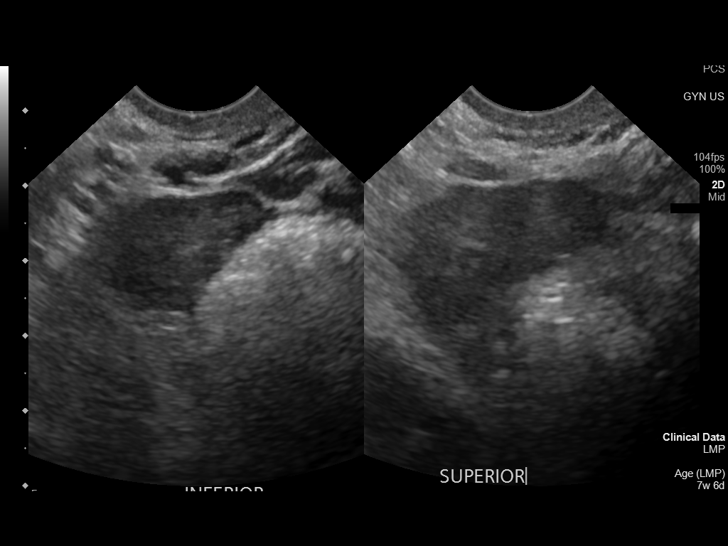
[im 72/81]
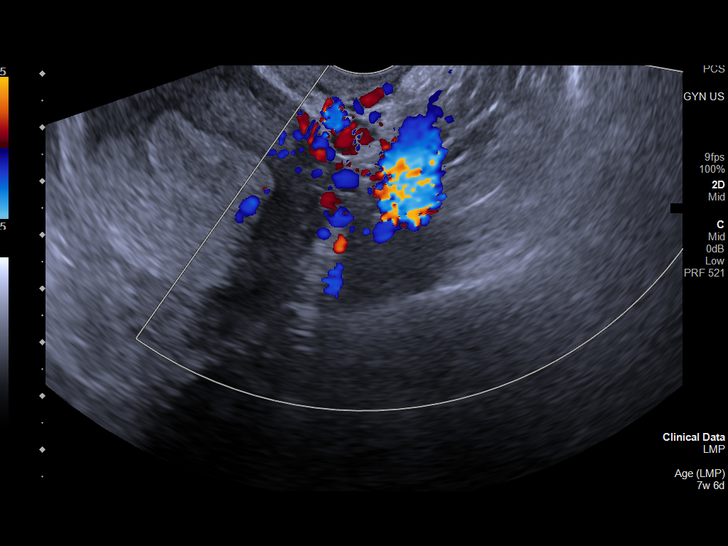
[im 78/81]
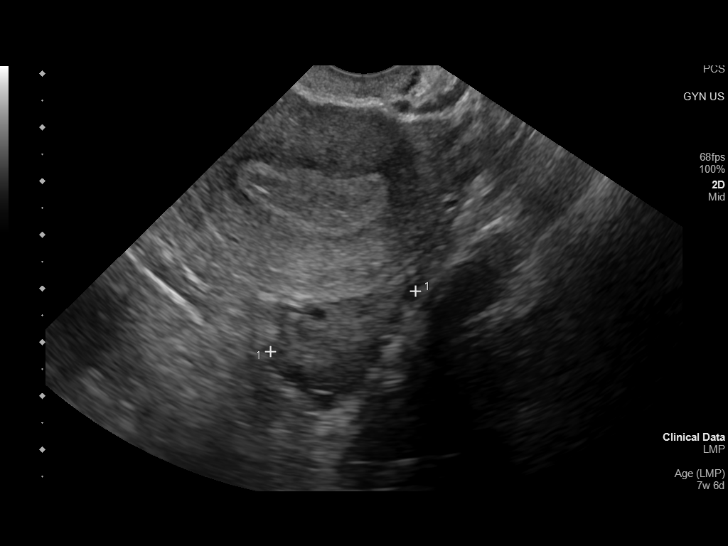

[13 of 28 positions shown; findings below may reference images not displayed]

FINDINGS: Intrauterine gestational sac: A single intrauterine gestational sac
is identified.

Yolk sac:  Not Visualized.

Embryo:  Not Visualized.

Cardiac Activity: Not Visualized.

MSD: 5 mm   5 w   2 d

Subchorionic hemorrhage:  None visualized.

Maternal uterus/adnexae: The uterus is anteverted. Endometrium is
expanded, likely gestational reaction. No myometrial mass lesions
are identified. Both ovaries are visualized and appear normal. No
abnormal adnexal masses. No free pelvic fluid collections.
IMPRESSION: Probable early intrauterine gestational sac, but no yolk sac, fetal
pole, or cardiac activity yet visualized. Recommend follow-up
quantitative B-HCG levels and follow-up US in 14 days to assess
viability. This recommendation follows SRU consensus guidelines:
Diagnostic Criteria for Nonviable Pregnancy Early in the First
Trimester. N Engl J Med 0987; [DATE].

## 2023-10-04 NOTE — L&D Delivery Note (Signed)
         Delivery Note   Annakate M Sheperd is a 23 y.o. G3P1011 at [redacted]w[redacted]d Estimated Date of Delivery: 08/05/24  PRE-OPERATIVE DIAGNOSIS:  1) [redacted]w[redacted]d pregnancy.   POST-OPERATIVE DIAGNOSIS:  1) [redacted]w[redacted]d pregnancy s/p    Delivery Type:   Spontaneous Vaginal delivery  Delivery Anesthesia:  None  Labor Complications:  Precipitous delivery    ESTIMATED BLOOD LOSS: 200 ml    FINDINGS:   1) female infant, Apgar scores of  8 at 1 minute and 9   at 5 minutes and a birthweight pending, infant remains skin to skin .   2) Nuchal cord: no  SPECIMENS:   PLACENTA:   Appearance:  intact, 3 vessel cord   Removal:    Spontaneous    Disposition:  per protocol   DISPOSITION:  Infant to left in stable condition in the delivery room, with L&D personnel and mother,  NARRATIVE SUMMARY: Labor course:  Ms. Keyerra M Radcliffe is a [id number] at [redacted]w[redacted]d who presented for labor management.  She progressed well in labor without pitocin .  She received the appropriate anesthesia and proceeded to complete dilation. She evidenced good maternal expulsive effort during the second stage. She went on to deliver a viable infant. The placenta delivered without problems and was noted to be complete. A perineal and vaginal examination was performed. Lacerations:  2nd degree , repaired with 3-0 vicryl Rapide  The patient tolerated this well.  Zelda Hummer, CNM  08/09/2024 8:25 AM

## 2023-10-12 ENCOUNTER — Encounter: Payer: Self-pay | Admitting: Family Medicine

## 2023-10-12 ENCOUNTER — Ambulatory Visit (INDEPENDENT_AMBULATORY_CARE_PROVIDER_SITE_OTHER): Payer: Medicaid Other | Admitting: Family Medicine

## 2023-10-12 VITALS — BP 118/78 | HR 106 | Ht 69.0 in | Wt 230.0 lb

## 2023-10-12 DIAGNOSIS — F32A Depression, unspecified: Secondary | ICD-10-CM

## 2023-10-12 DIAGNOSIS — F419 Anxiety disorder, unspecified: Secondary | ICD-10-CM | POA: Insufficient documentation

## 2023-10-12 MED ORDER — SERTRALINE HCL 25 MG PO TABS: 25.0000 mg | ORAL_TABLET | Freq: Every day | ORAL | 0 refills | Status: DC

## 2023-10-12 NOTE — Assessment & Plan Note (Signed)
 History of Present Illness The patient is a 23 year old new mother who presents with long-standing stress and anxiety issues that have been exacerbated since the birth of her child. The patient reports feeling on edge and stressed since high school and has struggled to find effective coping mechanisms. The patient also reports having gestational diabetes during her recent pregnancy, which was managed without medication. The patient has not had her blood sugar levels checked since giving birth. The patient denies any respiratory issues, although she reports some congestion due to a recent illness passed on from her infant daughter. The patient is currently breastfeeding and occasionally takes prenatal vitamins.  Physical Exam VITALS: BP- 118/78, SaO2- 97% CHEST: Lungs clear to auscultation bilaterally, no wheezes, rales, rhonchi. CARDIOVASCULAR: Positive S1-S2, regular rate and rhythm, no additional heart sounds auscultated.    Assessment and Plan Anxiety/depression Longstanding stress since high school, exacerbated by recent childbirth. Discussed the physiological basis of stress and the potential benefits of physical activity, mindfulness, cognitive behavioral therapy, and medication. -Start Sertraline  25mg  daily -Refer to psychiatry for comorbid ADHD concern expressed by patient and further optimization.

## 2023-10-12 NOTE — Progress Notes (Signed)
     Primary Care / Sports Medicine Office Visit  Patient Information:  Patient ID: Dana Murphy, female DOB: 03-19-2001 Age: 23 y.o. MRN: [id number]   Dana Murphy is a pleasant 23 y.o. female presenting with the following:  Chief Complaint  Patient presents with   Annual Exam    Patient presents today for hr annual wellness exam. She reports sleeping and eating well. She reports getting some exercise here and there. She does have some concerns, she feels that she has ADHD, struggling to focus, and anxiety.    Vitals:   10/12/23 1000  BP: 118/78  Pulse: (!) 106  SpO2: 97%   Vitals:   10/12/23 1000  Weight: 230 lb (104.3 kg)  Height: 5' 9 (1.753 m)   Body mass index is 33.97 kg/m.  No results found.   Independent interpretation of notes and tests performed by another provider:   None  Procedures performed:   None  Pertinent History, Exam, Impression, and Recommendations:   Problem List Items Addressed This Visit     Anxiety and depression - Primary   History of Present Illness The patient is a 20 year old new mother who presents with long-standing stress and anxiety issues that have been exacerbated since the birth of her child. The patient reports feeling on edge and stressed since high school and has struggled to find effective coping mechanisms. The patient also reports having gestational diabetes during her recent pregnancy, which was managed without medication. The patient has not had her blood sugar levels checked since giving birth. The patient denies any respiratory issues, although she reports some congestion due to a recent illness passed on from her infant daughter. The patient is currently breastfeeding and occasionally takes prenatal vitamins.  Physical Exam VITALS: BP- 118/78, SaO2- 97% CHEST: Lungs clear to auscultation bilaterally, no wheezes, rales, rhonchi. CARDIOVASCULAR: Positive S1-S2, regular rate and rhythm, no additional heart sounds  auscultated.    Assessment and Plan Anxiety/depression Longstanding stress since high school, exacerbated by recent childbirth. Discussed the physiological basis of stress and the potential benefits of physical activity, mindfulness, cognitive behavioral therapy, and medication. -Start Sertraline  25mg  daily -Refer to psychiatry for comorbid ADHD concern expressed by patient and further optimization.       Relevant Medications   sertraline  (ZOLOFT ) 25 MG tablet   Other Relevant Orders   Ambulatory referral to Psychiatry     Orders & Medications Medications:  Meds ordered this encounter  Medications   sertraline  (ZOLOFT ) 25 MG tablet    Sig: Take 1 tablet (25 mg total) by mouth daily.    Dispense:  60 tablet    Refill:  0   Orders Placed This Encounter  Procedures   Ambulatory referral to Psychiatry     Return in about 2 months (around 12/10/2023) for CPE.     Selinda JINNY Ku, MD, [facility]   Primary Care Sports Medicine Primary Care and Sports Medicine at [facility]

## 2023-10-12 NOTE — Patient Instructions (Signed)
 Your Plan:  -Anxiety/depression:   - Discussed benefits of physical activity, mindfulness, cognitive behavioral therapy, and medication.   - Start taking Sertraline  25mg  daily.   - Referred to psychiatry for potential ADHD evaluation and further stress management strategies.  - Postpartum Health:   -Keep regular follow-up with your OB.  - Gestational Diabetes:   - A type of diabetes that develops during pregnancy.   - Monitor blood glucose levels at home as recent levels haven't been checked.  - Respiratory Congestion:   - Mild congestion likely due to a recent illness from your child.   - Supportive care recommended, such as steam and saline rinses.  - General Health Maintenance:   - Routine check-ups and preventive care are essential.   - Schedule your annual physical in two months.   - Continue with cervical cancer screening as advised, with the next screening due in 2027.   - Tetanus shot is due in 2033.

## 2023-12-03 ENCOUNTER — Other Ambulatory Visit: Payer: Self-pay | Admitting: Family Medicine

## 2023-12-03 DIAGNOSIS — F32A Depression, unspecified: Secondary | ICD-10-CM

## 2023-12-04 NOTE — Telephone Encounter (Signed)
 Requested medication (s) are due for refill today - yes  Requested medication (s) are on the active medication list -yes  Future visit scheduled -yes  Last refill: 10/12/23 #60  Notes to clinic: fails lab protocol- over 1 year-12/10/18 and requesting 90 day supply  Requested Prescriptions  Pending Prescriptions Disp Refills   sertraline (ZOLOFT) 25 MG tablet [Pharmacy Med Name: SERTRALINE HCL 25 MG TABLET] 90 tablet 1    Sig: Take 1 tablet (25 mg total) by mouth daily.     Psychiatry:  Antidepressants - SSRI - sertraline Failed - 12/04/2023  4:18 PM      Failed - AST in normal range and within 360 days    AST  Date Value Ref Range Status  12/10/2018 16 15 - 41 U/L Final         Failed - ALT in normal range and within 360 days    ALT  Date Value Ref Range Status  12/10/2018 15 0 - 44 U/L Final         Passed - Completed PHQ-2 or PHQ-9 in the last 360 days      Passed - Valid encounter within last 6 months    Recent Outpatient Visits           1 month ago Anxiety and depression   [place] [facility] at [facility] Ashley Royalty, Ocie Bob, MD       Future Appointments             In 1 month Ashley Royalty Ocie Bob, MD [facility] Primary Care & Sports Medicine at [facility], [facility]               Requested Prescriptions  Pending Prescriptions Disp Refills   sertraline (ZOLOFT) 25 MG tablet [Pharmacy Med Name: SERTRALINE HCL 25 MG TABLET] 90 tablet 1    Sig: Take 1 tablet (25 mg total) by mouth daily.     Psychiatry:  Antidepressants - SSRI - sertraline Failed - 12/04/2023  4:18 PM      Failed - AST in normal range and within 360 days    AST  Date Value Ref Range Status  12/10/2018 16 15 - 41 U/L Final         Failed - ALT in normal range and within 360 days    ALT  Date Value Ref Range Status  12/10/2018 15 0 - 44 U/L Final         Passed - Completed PHQ-2 or PHQ-9 in the last 360 days      Passed - Valid encounter within last 6 months     Recent Outpatient Visits           1 month ago Anxiety and depression   [place] [facility] at [facility], Ocie Bob, MD       Future Appointments             In 1 month Ashley Royalty, Ocie Bob, MD [facility] Primary Care & Sports Medicine at [facility], [facility]

## 2023-12-07 NOTE — Progress Notes (Signed)
    NURSE VISIT NOTE  Subjective:    Patient ID: Dana Murphy, female    DOB: Aug 06, 2001, 23 y.o.   MRN: [id number]  HPI  Patient is a 23 y.o. G71P1011 female who presents for evaluation of amenorrhea. She believes she could be pregnant. Patient is ambivalent about pregnancy. Sexual Activity: single partner, contraception: none. Current symptoms also include: fatigue, morning sickness, and positive home pregnancy test. Last period was normal.    Objective:    Ht 5\' 9"  (1.753 m)   Wt 236 lb (107 kg)   LMP 10/30/2023   BMI 34.85 kg/m   Lab Review  No results found for any visits on 12/08/23.  Assessment:   1. Missed period     Plan:   Pregnancy Test: Positive  Estimated Date of Delivery: 08/05/24 BP Cuff Measurement taken. Cuff Size Adult Large Encouraged well-balanced diet, plenty of rest when needed, pre-natal vitamins daily and walking for exercise.  Discussed self-help for nausea, avoiding OTC medications until consulting provider or pharmacist, other than Tylenol as needed, minimal caffeine (1-2 cups daily) and avoiding alcohol.   She will schedule her nurse visit @ 7-[redacted] wks pregnant, u/s for dating @10  wk, and NOB visit at [redacted] wk pregnant.    Feel free to call with any questions.     Cornelius Moras, CMA

## 2023-12-08 ENCOUNTER — Ambulatory Visit

## 2023-12-08 VITALS — BP 112/70 | HR 88 | Ht 69.0 in | Wt 236.0 lb

## 2023-12-08 DIAGNOSIS — Z3201 Encounter for pregnancy test, result positive: Secondary | ICD-10-CM | POA: Diagnosis not present

## 2023-12-08 DIAGNOSIS — N926 Irregular menstruation, unspecified: Secondary | ICD-10-CM

## 2023-12-08 DIAGNOSIS — Z3687 Encounter for antenatal screening for uncertain dates: Secondary | ICD-10-CM

## 2023-12-08 LAB — POCT URINE PREGNANCY: Preg Test, Ur: POSITIVE — AB

## 2023-12-13 DIAGNOSIS — F411 Generalized anxiety disorder: Secondary | ICD-10-CM | POA: Diagnosis not present

## 2023-12-13 DIAGNOSIS — F330 Major depressive disorder, recurrent, mild: Secondary | ICD-10-CM | POA: Diagnosis not present

## 2023-12-19 ENCOUNTER — Other Ambulatory Visit: Payer: Self-pay | Admitting: Family Medicine

## 2023-12-19 DIAGNOSIS — F419 Anxiety disorder, unspecified: Secondary | ICD-10-CM

## 2023-12-20 DIAGNOSIS — F411 Generalized anxiety disorder: Secondary | ICD-10-CM | POA: Diagnosis not present

## 2023-12-20 DIAGNOSIS — F330 Major depressive disorder, recurrent, mild: Secondary | ICD-10-CM | POA: Diagnosis not present

## 2023-12-21 NOTE — Telephone Encounter (Signed)
 Requested medication (s) are due for refill today: no  Requested medication (s) are on the active medication list: yes  Last refill:  12/05/23 #30  Future visit scheduled: yes  Notes to clinic:  too early to refill- overdue labs   Requested Prescriptions  Pending Prescriptions Disp Refills   sertraline (ZOLOFT) 25 MG tablet [Pharmacy Med Name: SERTRALINE HCL 25 MG TABLET] 90 tablet 1    Sig: TAKE 1 TABLET (25 MG TOTAL) BY MOUTH DAILY.     Psychiatry:  Antidepressants - SSRI - sertraline Failed - 12/21/2023  8:28 AM      Failed - AST in normal range and within 360 days    AST  Date Value Ref Range Status  12/10/2018 16 15 - 41 U/L Final         Failed - ALT in normal range and within 360 days    ALT  Date Value Ref Range Status  12/10/2018 15 0 - 44 U/L Final         Passed - Completed PHQ-2 or PHQ-9 in the last 360 days      Passed - Valid encounter within last 6 months    Recent Outpatient Visits           2 months ago Anxiety and depression   [place] [facility] at [facility], Ocie Bob, MD       Future Appointments             Tomorrow  [facility] at [facility]   In 2 weeks Ashley Royalty, Ocie Bob, MD [facility] Primary Care & Sports Medicine at [facility], [facility]

## 2023-12-22 ENCOUNTER — Telehealth

## 2023-12-25 ENCOUNTER — Telehealth (INDEPENDENT_AMBULATORY_CARE_PROVIDER_SITE_OTHER)

## 2023-12-25 DIAGNOSIS — Z3481 Encounter for supervision of other normal pregnancy, first trimester: Secondary | ICD-10-CM | POA: Diagnosis not present

## 2023-12-25 DIAGNOSIS — Z3A08 8 weeks gestation of pregnancy: Secondary | ICD-10-CM

## 2023-12-25 DIAGNOSIS — O0990 Supervision of high risk pregnancy, unspecified, unspecified trimester: Secondary | ICD-10-CM | POA: Insufficient documentation

## 2023-12-25 DIAGNOSIS — Z3490 Encounter for supervision of normal pregnancy, unspecified, unspecified trimester: Secondary | ICD-10-CM | POA: Insufficient documentation

## 2023-12-25 NOTE — Progress Notes (Addendum)
 New OB Intake  I connected with  Dana Murphy on 12/25/23 at  2:15 PM EDT by MyChart Video Visit and verified that I am speaking with the correct person using two identifiers. Nurse is located at [facility] and pt is located at Home.  I discussed the limitations, risks, security and privacy concerns of performing an evaluation and management service by telephone and the availability of in person appointments. I also discussed with the patient that there may be a patient responsible charge related to this service. The patient expressed understanding and agreed to proceed.  I explained I am completing New OB Intake today. We discussed her EDD of 08/05/2024 that is based on LMP of 10/30/2023. Pt is G3/P1011. I reviewed her allergies, medications, Medical/Surgical/OB history, and appropriate screenings. There are cats in the home.  If yes Outdoor. Based on history, this is a/an pregnancy complicated by gestational diabetes and obesity and thyroid nodules that were benign in the past.  Her obstetrical history is significant for  Gestational Diabetes .  Patient Active Problem List   Diagnosis Date Noted   Anxiety and depression 10/12/2023   Enlarged thyroid 02/26/2020    Concerns addressed today: She has no new concerns.  Delivery Plans:  Plans to deliver at [facility]. Yes  Anatomy US Explained first scheduled [place] will be around 20 weeks.  Pt notified to arrive at least 15 minutes early and no children allowed.  Labs Discussed genetic screening with patient. Patient agrees to genetic testing to be drawn at new OB visit. Discussed possible labs to be drawn at new OB appointment.  COVID Vaccine Patient has not had COVID vaccine.   Social Determinants of Health Food Insecurity: denies food insecurity WIC Referral: Patient is interested in referral to [facility].  Transportation: Patient denies transportation needs. Childcare: Discussed no children allowed at ultrasound  appointments.   First visit review I reviewed new OB appt with pt. I explained she will have blood work and pap smear/pelvic exam if indicated. Explained pt will be seen by Doreene Burke, CNM at first visit; encounter routed to appropriate provider.     Tommie Raymond, CMA 12/25/2023  2:29 PM

## 2023-12-25 NOTE — Patient Instructions (Signed)
 Morning Sickness Morning sickness is when you throw up or feel like you may throw up during pregnancy. This condition often occurs in the morning, but it can also occur at any time of day. Morning sickness is most common during the first three months of pregnancy, but it can go on throughout the pregnancy. Morning sickness is usually harmless. But if you throw up all the time, you should see your health care provider. You may also hear this condition called nausea and vomiting of pregnancy. What are the causes? The cause of morning sickness is not known. It may be linked to changes in hormones during pregnancy. What increases the risk? You're more likely to have morning sickness if: You had morning sickness in another pregnancy. You're pregnant with more than one baby, such as twins. You had morning sickness in other pregnancies. You have had motion sickness before you were pregnant. You have had bad headaches or migraines before you were pregnant. What are the signs or symptoms? Symptoms of morning sickness include: Feeling like you may throw up. Throwing up. How is this diagnosed? Morning sickness is diagnosed based on your symptoms. How is this treated? Treatment is usually not needed for morning sickness. You may only need to change what you eat. In some cases, your provider may give you: Vitamin B6 supplements. Medicines to prevent throwing up. Ginger. Follow these instructions at home: Medicines Take your medicines only as told by your provider. Do not use any prescription, over-the-counter, or herbal medicines for morning sickness without first talking with your provider. Take prenatal vitamins. These can stop or lessen the symptoms of morning sickness. If you feel like you may throw up after taking prenatal vitamins, take them at night or with a snack. Eating and drinking     Eat dry toast or crackers before getting out of bed. Eat 5 or 6 small meals a day. Try ginger ale  made with real ginger, ginger tea, or ginger candies. Drink fluids throughout the day. Eat protein foods when you need a snack. Nuts, yogurt, and cheese are good choices. Eat dry and bland foods like rice or baked potatoes. Foods that are high in carbohydrates are often helpful. Have someone cook for you if the smell of food makes you want to throw up. Foods to avoid Greasy foods. Fatty foods. Spicy foods. General instructions Try to avoid smells that make you feel sick. Use an air purifier to keep the air in your house free of smells. Try using an acupressure wristband. This is a wristband that's used to treat motion sickness. Try acupuncture. In this treatment, a provider puts thin needles into certain areas of your body to make you feel better. Brush your teeth after throwing up or rinse with a mix of baking soda and water. The acid in throw-up can hurt your teeth. Contact a health care provider if: Your symptoms do not get better. You feel dizzy or light-headed. You're losing weight. Get help right away if: The feeling that you may throw up will not go away, or you can't stop throwing up. You faint. You have very bad pain in your belly. This information is not intended to replace advice given to you by your health care provider. Make sure you discuss any questions you have with your health care provider. Document Revised: 06/22/2023 Document Reviewed: 12/29/2022 Elsevier Patient Education  2024 Elsevier Inc.First Trimester of Pregnancy  The first trimester of pregnancy starts on the first day of your last monthly period  until the end of week 13. This is months 1 through 3 of pregnancy. A week after a sperm fertilizes an egg, the egg will implant into the wall of the uterus and begin to develop into a baby. Body changes during your first trimester Your body goes through many changes during pregnancy. The changes usually return to normal after your baby is born. Physical  changes Your breasts may grow larger and may hurt. The area around your nipples may get darker. Your periods will stop. Your hair and nails may grow faster. You may pee more often. Health changes You may tire easily. Your gums may bleed and may be sensitive when you brush and floss. You may not feel hungry. You may have heartburn. You may throw up or feel like you may throw up. You may want to eat some foods, but not others. You may have headaches. You may have trouble pooping (constipation). Other changes Your emotions may change from day to day. You may have more dreams. Follow these instructions at home: Medicines Talk to your health care provider if you're taking medicines. Ask if the medicines are safe to take during pregnancy. Your provider may change the medicines that you take. Do not take any medicines unless told to by your provider. Take a prenatal vitamin that has at least 600 micrograms (mcg) of folic acid. Do not use herbal medicines, illegal substances, or medicines that are not approved by your provider. Eating and drinking While you're pregnant your body needs extra food for your growing baby. Talk with your provider about what to eat while pregnant. Activity Most women are able to exercise during pregnancy. Exercises may need to change as your pregnancy goes on. Talk to your provider about your activities and exercise routines. Relieving pain and discomfort Wear a good, supportive bra if your breasts hurt. Rest with your legs raised if you have leg cramps or low back pain. Safety Wear your seatbelt at all times when you're in a car. Talk to your provider if someone hits you, hurts you, or yells at you. Talk with your provider if you're feeling sad or have thoughts of hurting yourself. Lifestyle Certain things can be harmful while you're pregnant. Follow these rules: Do not use hot tubs, steam rooms, or saunas. Do not douche. Do not use tampons or scented  pads. Do not drink alcohol,smoke, vape, or use products with nicotine or tobacco in them. If you need help quitting, talk with your provider. Avoid cat litter boxes and soil used by cats. These things carry germs that can cause harm to your pregnancy and your baby. General instructions Keep all follow-up visits. It helps you and your unborn baby stay as healthy as possible. Write down your questions. Take them to your visits. Your provider will: Talk with you about your overall health. Give you advice or refer you to specialists who can help with different needs, including: Prenatal education classes. Mental health and counseling. Foods and healthy eating. Ask for help if you need help with food. Call your dentist and ask to be seen. Brush your teeth with a soft toothbrush. Floss gently. Where to find more information American Pregnancy Association: americanpregnancy.org [employer]: acog.org Office on [employer] Health: [employer] Contact a health care provider if: You feel dizzy, faint, or have a fever. You vomit or have watery poop (diarrhea) for 2 days or more. You have abnormal discharge or bleeding from your vagina. You have pain when you pee or  your pee smells bad. You have cramps, pain, or pressure in your belly area. Get help right away if: You have trouble breathing or chest pain. You have any kind of injury, such as from a fall or a car crash. These symptoms may be an emergency. Get help right away. Call 911. Do not wait to see if the symptoms will go away. Do not drive yourself to the hospital. This information is not intended to replace advice given to you by your health care provider. Make sure you discuss any questions you have with your health care provider. Document Revised: 06/22/2023 Document Reviewed: 01/20/2023 Elsevier Patient Education  2024 Elsevier Inc.Commonly Asked Questions During Pregnancy  Cats: A parasite can be  excreted in cat feces.  To avoid exposure you need to have another person empty the little box.  If you must empty the litter box you will need to wear gloves.  Wash your hands after handling your cat.  This parasite can also be found in raw or undercooked meat so this should also be avoided.  Colds, Sore Throats, Flu: Please check your medication sheet to see what you can take for symptoms.  If your symptoms are unrelieved by these medications please call the office.  Dental Work: Most any dental work Agricultural consultant recommends is permitted.  X-rays should only be taken during the first trimester if absolutely necessary.  Your abdomen should be shielded with a lead apron during all x-rays.  Please notify your provider prior to receiving any x-rays.  Novocaine is fine; gas is not recommended.  If your dentist requires a note from [place] prior to dental work please call the office and we will provide one for you.  Exercise: Exercise is an important part of staying healthy during your pregnancy.  You may continue most exercises you were accustomed to prior to pregnancy.  Later in your pregnancy you will most likely notice you have difficulty with activities requiring balance like riding a bicycle.  It is important that you listen to your body and avoid activities that put you at a higher risk of falling.  Adequate rest and staying well hydrated are a must!  If you have questions about the safety of specific activities ask your provider.    Exposure to Children with illness: Try to avoid obvious exposure; report any symptoms to [place] when noted,  If you have chicken pos, red measles or mumps, you should be immune to these diseases.   Please do not take any vaccines while pregnant unless you have checked with your OB provider.  Fetal Movement: After 28 weeks we recommend you do "kick counts" twice daily.  Lie or sit down in a calm quiet environment and count your baby movements "kicks".  You should feel your baby at  least 10 times per hour.  If you have not felt 10 kicks within the first hour get up, walk around and have something sweet to eat or drink then repeat for an additional hour.  If count remains less than 10 per hour notify your provider.  Fumigating: Follow your pest control agent's advice as to how long to stay out of your home.  Ventilate the area well before re-entering.  Hemorrhoids:   Most over-the-counter preparations can be used during pregnancy.  Check your medication to see what is safe to use.  It is important to use a stool softener or fiber in your diet and to drink lots of liquids.  If hemorrhoids seem to be  getting worse please call the office.   Hot Tubs:  Hot tubs Jacuzzis and saunas are not recommended while pregnant.  These increase your internal body temperature and should be avoided.  Intercourse:  Sexual intercourse is safe during pregnancy as long as you are comfortable, unless otherwise advised by your provider.  Spotting may occur after intercourse; report any bright red bleeding that is heavier than spotting.  Labor:  If you know that you are in labor, please go to the hospital.  If you are unsure, please call the office and let us help you decide what to do.  Lifting, straining, etc:  If your job requires heavy lifting or straining please check with your provider for any limitations.  Generally, you should not lift items heavier than that you can lift simply with your hands and arms (no back muscles)  Painting:  Paint fumes do not harm your pregnancy, but may make you ill and should be avoided if possible.  Latex or water based paints have less odor than oils.  Use adequate ventilation while painting.  Permanents & Hair Color:  Chemicals in hair dyes are not recommended as they cause increase hair dryness which can increase hair loss during pregnancy.  " Highlighting" and permanents are allowed.  Dye may be absorbed differently and permanents may not hold as well during  pregnancy.  Sunbathing:  Use a sunscreen, as skin burns easily during pregnancy.  Drink plenty of fluids; avoid over heating.  Tanning Beds:  Because their possible side effects are still unknown, tanning beds are not recommended.  Ultrasound Scans:  Routine ultrasounds are performed at approximately 20 weeks.  You will be able to see your baby's general anatomy an if you would like to know the gender this can usually be determined as well.  If it is questionable when you conceived you may also receive an ultrasound early in your pregnancy for dating purposes.  Otherwise ultrasound exams are not routinely performed unless there is a medical necessity.  Although you can request a scan we ask that you pay for it when conducted because insurance does not cover " patient request" scans.  Work: If your pregnancy proceeds without complications you may work until your due date, unless your physician or employer advises otherwise.  Round Ligament Pain/Pelvic Discomfort:  Sharp, shooting pains not associated with bleeding are fairly common, usually occurring in the second trimester of pregnancy.  They tend to be worse when standing up or when you remain standing for long periods of time.  These are the result of pressure of certain pelvic ligaments called "round ligaments".  Rest, Tylenol and heat seem to be the most effective relief.  As the womb and fetus grow, they rise out of the pelvis and the discomfort improves.  Please notify the office if your pain seems different than that described.  It may represent a more serious condition.  Common Medications Safe in Pregnancy  Acne:      Constipation:  Benzoyl Peroxide     Colace  Clindamycin      Dulcolax Suppository  Topica Erythromycin     Fibercon  Salicylic Acid      Metamucil         Miralax AVOID:        Senakot   Accutane    Cough:  Retin-A       Cough Drops  Tetracycline      Phenergan w/ Codeine if Rx  Minocycline  Robitussin (Plain &  DM)  Antibiotics:     Crabs/Lice:  Ceclor       RID  Cephalosporins    AVOID:  E-Mycins      Kwell  Keflex  Macrobid/Macrodantin   Diarrhea:  Penicillin      Kao-Pectate  Zithromax      Imodium AD         PUSH FLUIDS AVOID:       Cipro     Fever:  Tetracycline      Tylenol (Regular or Extra  Minocycline       Strength)  Levaquin      Extra Strength-Do not          Exceed 8 tabs/24 hrs Caffeine:        200mg /day (equiv. To 1 cup of coffee or  approx. 3 12 oz sodas)         Gas: Cold/Hayfever:       Gas-X  Benadryl      Mylicon  Claritin       Phazyme  **Claritin-D        Chlor-Trimeton    Headaches:  Dimetapp      ASA-Free Excedrin  Drixoral-Non-Drowsy     Cold Compress  Mucinex (Guaifenasin)     Tylenol (Regular or Extra  Sudafed/Sudafed-12 Hour     Strength)  **Sudafed PE Pseudoephedrine   Tylenol Cold & Sinus     Vicks Vapor Rub  Zyrtec  **AVOID if Problems With Blood Pressure         Heartburn: Avoid lying down for at least 1 hour after meals  Aciphex      Maalox     Rash:  Milk of Magnesia     Benadryl    Mylanta       1% Hydrocortisone Cream  Pepcid  Pepcid Complete   Sleep Aids:  Prevacid      Ambien   Prilosec       Benadryl  Rolaids       Chamomile Tea  Tums (Limit 4/day)     Unisom         Tylenol PM         Warm milk-add vanilla or  Hemorrhoids:       Sugar for taste  Anusol/Anusol H.C.  (RX: Analapram 2.5%)  Sugar Substitutes:  Hydrocortisone OTC     Ok in moderation  Preparation H      Tucks        Vaseline lotion applied to tissue with wiping    Herpes:     Throat:  Acyclovir      Oragel  Famvir  Valtrex     Vaccines:         Flu Shot Leg Cramps:       *Gardasil  Benadryl      Hepatitis A         Hepatitis B Nasal Spray:       Pneumovax  Saline Nasal Spray     Polio Booster         Tetanus Nausea:       Tuberculosis test or PPD  Vitamin B6 25 mg TID   AVOID:    Dramamine      *Gardasil  Emetrol       Live Poliovirus  Ginger  Root 250 mg QID    MMR (measles, mumps &  High Complex Carbs @ Bedtime    rebella)  Sea Bands-Accupressure    Varicella (Chickenpox)  Unisom 1/2  tab TID     *No known complications           If received before Pain:         Known pregnancy;   Darvocet       Resume series after  Lortab        Delivery  Percocet    Yeast:   Tramadol      Femstat  Tylenol 3      Gyne-lotrimin  Ultram       Monistat  Vicodin           MISC:         All Sunscreens           Hair Coloring/highlights          Insect Repellant's          (Including DEET)         Mystic Tans

## 2023-12-27 DIAGNOSIS — F411 Generalized anxiety disorder: Secondary | ICD-10-CM | POA: Diagnosis not present

## 2023-12-27 DIAGNOSIS — F330 Major depressive disorder, recurrent, mild: Secondary | ICD-10-CM | POA: Diagnosis not present

## 2024-01-03 DIAGNOSIS — F411 Generalized anxiety disorder: Secondary | ICD-10-CM | POA: Diagnosis not present

## 2024-01-03 DIAGNOSIS — F330 Major depressive disorder, recurrent, mild: Secondary | ICD-10-CM | POA: Diagnosis not present

## 2024-01-04 ENCOUNTER — Encounter: Payer: Self-pay | Admitting: Family Medicine

## 2024-01-04 ENCOUNTER — Ambulatory Visit (INDEPENDENT_AMBULATORY_CARE_PROVIDER_SITE_OTHER): Payer: Self-pay | Admitting: Family Medicine

## 2024-01-04 VITALS — BP 124/76 | HR 95 | Ht 69.0 in | Wt 231.8 lb

## 2024-01-04 DIAGNOSIS — E042 Nontoxic multinodular goiter: Secondary | ICD-10-CM

## 2024-01-04 DIAGNOSIS — Z0000 Encounter for general adult medical examination without abnormal findings: Secondary | ICD-10-CM | POA: Diagnosis not present

## 2024-01-04 DIAGNOSIS — Z136 Encounter for screening for cardiovascular disorders: Secondary | ICD-10-CM

## 2024-01-04 DIAGNOSIS — R7309 Other abnormal glucose: Secondary | ICD-10-CM

## 2024-01-04 DIAGNOSIS — F17290 Nicotine dependence, other tobacco product, uncomplicated: Secondary | ICD-10-CM

## 2024-01-04 DIAGNOSIS — R5383 Other fatigue: Secondary | ICD-10-CM

## 2024-01-04 DIAGNOSIS — F419 Anxiety disorder, unspecified: Secondary | ICD-10-CM

## 2024-01-04 DIAGNOSIS — F32A Depression, unspecified: Secondary | ICD-10-CM

## 2024-01-04 DIAGNOSIS — Z3490 Encounter for supervision of normal pregnancy, unspecified, unspecified trimester: Secondary | ICD-10-CM

## 2024-01-04 NOTE — Patient Instructions (Addendum)
-   Obtain fasting labs with orders provided (can have water or black coffee but otherwise no food or drink x 8 hours before labs) - Review information provided - Attend eye doctor annually, dentist every 6 months, work towards or maintain 30 minutes of moderate intensity physical activity at least 5 days per week, and consume a balanced diet - Return in 1 year for physical - Contact us for any questions between now and then   Patient Plan for Post-Visit Guidance  1. Stress and Anxiety Management:    - Engage in regular prenatal yoga.    - Practice mindfulness techniques daily.    - Continue attending cognitive behavioral therapy sessions.    - Ensure you get adequate sleep and use a white noise machine to help improve sleep quality.  2. Pregnancy Care:    - Follow up on risk stratification labs once results are available.    - Continue regular prenatal check-ups as advised.  3. Thyroid Nodules:    - Get a comprehensive thyroid panel done, including TSH, free T3, and free T4 tests.    - Consider a repeat ultrasound of your thyroid if lab results show any abnormalities.  4. Fatigue and Sleep Concerns:    - Schedule an evaluation with a sleep medicine specialist to assess for possible sleep apnea.  5. Nicotine Dependence:    - Discuss the possibility of using bupropion for smoking cessation with your obstetrician.    - Contact us if you decide to proceed with a prescription for bupropion.  Please follow these steps and reach out if you have any questions or concerns.

## 2024-01-09 DIAGNOSIS — R5383 Other fatigue: Secondary | ICD-10-CM | POA: Insufficient documentation

## 2024-01-09 NOTE — Assessment & Plan Note (Addendum)
 She is currently nine weeks pregnant with her second child and is experiencing more nausea compared to her first pregnancy.  Annual examination completed, risk stratification labs ordered, anticipatory guidance provided.  We will follow labs once resulted.

## 2024-01-09 NOTE — Assessment & Plan Note (Signed)
 She was previously on sertraline for stress but discontinued it due to concerns about potential complications in the third trimester. She is now exploring non-medication strategies for managing stress. She has been engaging in prenatal yoga and finds it beneficial. She has been seeing a therapist for about a month, which she finds helpful for managing stress and anxiety.  Stress and Anxiety Stress and anxiety managed with non-pharmacological interventions. Prenatal yoga, mindfulness, and cognitive behavioral therapy recommended. - Engage in regular physical activity, such as prenatal yoga. - Practice mindfulness techniques regularly. - Continue cognitive behavioral therapy sessions with current therapist. - Ensure adequate sleep and continue white noise for improved sleep quality.

## 2024-01-09 NOTE — Assessment & Plan Note (Signed)
 Nicotine Dependence Currently using nicotine vape, attempting cessation. Considering bupropion for smoking cessation, pending obstetrician consultation. - Discuss bupropion for smoking cessation with obstetrician. - Contact us if needing Rx.

## 2024-01-09 NOTE — Assessment & Plan Note (Signed)
 Her sleep has been affected by her daughter's sleep patterns, but she uses a white noise machine to aid in sleep. She reports snoring and tiredness.  Fatigue Reports of snoring and daytime fatigue suggest possible sleep apnea, impacting sleep quality and health during pregnancy. - Refer to sleep medicine for evaluation.

## 2024-01-09 NOTE — Assessment & Plan Note (Signed)
 She has a history of thyroid nodules that were biopsied and found to be benign. She did not follow up due to financial constraints but is open to further evaluation if necessary.  Thyroid Nodules Thyroid nodules previously biopsied with benign results. Reassessment may be needed. - Order comprehensive thyroid panel including specific thyroid hormone tests. - Consider repeat ultrasound of thyroid if lab results indicate abnormalities.

## 2024-01-09 NOTE — Progress Notes (Signed)
 Annual Physical Exam Visit  Patient Information:  Patient ID: Dana Murphy, female DOB: 2000-12-24 Age: 23 y.o. MRN: [id number]   Subjective:   CC: Annual Physical Exam  HPI:  Dana Murphy is here for their annual physical.  I reviewed the past medical history, family history, social history, surgical history, and allergies today and changes were made as necessary.  Please see the problem list section below for additional details.  Past Medical History: Past Medical History:  Diagnosis Date   Family history of diabetes mellitus (DM) 03/14/2022   Gestational diabetes 2024   no medications needed   Gestational diabetes mellitus (GDM) affecting second pregnancy 09/10/2022   Past Surgical History: Past Surgical History:  Procedure Laterality Date   NO PAST SURGERIES     Family History: Family History  Problem Relation Age of Onset   Miscarriages / [place] Mother        had two second trimester losses (> 20wk)   Diabetes Mellitus II Mother    Hypertension Mother    Diabetes Mellitus II Father    Hypertension Father    Lung cancer Maternal Grandfather    Cancer Neg Hx    Thyroid disease Neg Hx    Hyperlipidemia Neg Hx    Allergies: No Known Allergies Health Maintenance: Health Maintenance  Topic Date Due   Pneumococcal Vaccine 58-110 Years old (1 of 2 - PCV) Never done   HPV VACCINES (1 - 3-dose series) Never done   Cervical Cancer Screening (Pap smear)  12/18/2025   DTaP/Tdap/Td (2 - Td or Tdap) 08/31/2032   Hepatitis C Screening  Completed   HIV Screening  Completed   INFLUENZA VACCINE  Discontinued   COVID-19 Vaccine  Discontinued    HM Colonoscopy   This patient has no relevant Health Maintenance data.    Medications: Current Outpatient Medications on File Prior to Visit  Medication Sig Dispense Refill   Prenatal Multivit-Min-Fe-FA (PRE-NATAL PO) Take 1 tablet by mouth daily.     No current facility-administered medications on file prior to  visit.    Objective:   Vitals:   01/04/24 0957  BP: 124/76  Pulse: 95  SpO2: 97%   Vitals:   01/04/24 0957  Weight: 231 lb 12.8 oz (105.1 kg)  Height: 5\' 9"  (1.753 m)   Body mass index is 34.23 kg/m.  General: Well Developed, well nourished, and in no acute distress.  Neuro: Alert and oriented x3, extra-ocular muscles intact, sensation grossly intact. Cranial nerves II through XII are grossly intact, motor, sensory, and coordinative functions are intact. HEENT: Normocephalic, atraumatic, neck supple, no masses, no lymphadenopathy, thyroid nonenlarged. Oropharynx, nasopharynx, external ear canals are unremarkable. Skin: Warm and dry, no rashes noted.  Cardiac: Regular rate and rhythm, no murmurs rubs or gallops. No peripheral edema. Pulses symmetric. Respiratory: Clear to auscultation bilaterally. Speaking in full sentences.  Abdominal: Gravid. Soft, nontender, nondistended, positive bowel sounds, no masses, no organomegaly. Musculoskeletal: Stable, and with full range of motion.  Impression and Recommendations:   The patient was counselled, risk factors were discussed, and anticipatory guidance given.  Problem List Items Addressed This Visit     Anxiety and depression   She was previously on sertraline for stress but discontinued it due to concerns about potential complications in the third trimester. She is now exploring non-medication strategies for managing stress. She has been engaging in prenatal yoga and finds it beneficial. She has been seeing a therapist for about a month, which  she finds helpful for managing stress and anxiety.  Stress and Anxiety Stress and anxiety managed with non-pharmacological interventions. Prenatal yoga, mindfulness, and cognitive behavioral therapy recommended. - Engage in regular physical activity, such as prenatal yoga. - Practice mindfulness techniques regularly. - Continue cognitive behavioral therapy sessions with current therapist. -  Ensure adequate sleep and continue white noise for improved sleep quality.      Healthcare maintenance - Primary   She is currently nine weeks pregnant with her second child and is experiencing more nausea compared to her first pregnancy.  Annual examination completed, risk stratification labs ordered, anticipatory guidance provided.  We will follow labs once resulted.      Relevant Orders   CBC   Multiple thyroid nodules   She has a history of thyroid nodules that were biopsied and found to be benign. She did not follow up due to financial constraints but is open to further evaluation if necessary.  Thyroid Nodules Thyroid nodules previously biopsied with benign results. Reassessment may be needed. - Order comprehensive thyroid panel including specific thyroid hormone tests. - Consider repeat ultrasound of thyroid if lab results indicate abnormalities.      Relevant Orders   TSH   T3, free   T4, free   Other fatigue   Her sleep has been affected by her daughter's sleep patterns, but she uses a white noise machine to aid in sleep. She reports snoring and tiredness.  Fatigue Reports of snoring and daytime fatigue suggest possible sleep apnea, impacting sleep quality and health during pregnancy. - Refer to sleep medicine for evaluation.      Relevant Orders   Ambulatory referral to Pulmonology   Supervision of normal pregnancy   Vaping nicotine dependence, tobacco product   Nicotine Dependence Currently using nicotine vape, attempting cessation. Considering bupropion for smoking cessation, pending obstetrician consultation. - Discuss bupropion for smoking cessation with obstetrician. - Contact us if needing Rx.       Other Visit Diagnoses       Screening for cardiovascular condition       Relevant Orders   Lipid panel     Abnormal glucose       Relevant Orders   Comprehensive metabolic panel with GFR   Hemoglobin A1c        Orders & Medications Medications: No  orders of the defined types were placed in this encounter.  Orders Placed This Encounter  Procedures   CBC   Comprehensive metabolic panel with GFR   Hemoglobin A1c   Lipid panel   TSH   T3, free   T4, free   Ambulatory referral to Pulmonology     Return in about 1 year (around 01/03/2025) for CPE.    Jerrol Banana, MD, [facility]   Primary Care Sports Medicine Primary Care and Sports Medicine at [facility]

## 2024-01-10 DIAGNOSIS — F411 Generalized anxiety disorder: Secondary | ICD-10-CM | POA: Diagnosis not present

## 2024-01-10 DIAGNOSIS — F330 Major depressive disorder, recurrent, mild: Secondary | ICD-10-CM | POA: Diagnosis not present

## 2024-01-16 ENCOUNTER — Ambulatory Visit

## 2024-01-16 DIAGNOSIS — Z3687 Encounter for antenatal screening for uncertain dates: Secondary | ICD-10-CM | POA: Diagnosis not present

## 2024-01-16 DIAGNOSIS — N926 Irregular menstruation, unspecified: Secondary | ICD-10-CM

## 2024-01-16 DIAGNOSIS — Z3A10 10 weeks gestation of pregnancy: Secondary | ICD-10-CM | POA: Diagnosis not present

## 2024-01-24 DIAGNOSIS — F330 Major depressive disorder, recurrent, mild: Secondary | ICD-10-CM | POA: Diagnosis not present

## 2024-01-24 DIAGNOSIS — F411 Generalized anxiety disorder: Secondary | ICD-10-CM | POA: Diagnosis not present

## 2024-01-26 ENCOUNTER — Encounter: Payer: Self-pay | Admitting: Certified Nurse Midwife

## 2024-01-26 ENCOUNTER — Ambulatory Visit (INDEPENDENT_AMBULATORY_CARE_PROVIDER_SITE_OTHER): Admitting: Certified Nurse Midwife

## 2024-01-26 ENCOUNTER — Other Ambulatory Visit (HOSPITAL_COMMUNITY)
Admission: RE | Admit: 2024-01-26 | Discharge: 2024-01-26 | Disposition: A | Discharge status: Home / Self Care | Source: Ambulatory Visit | Attending: Certified Nurse Midwife | Admitting: Certified Nurse Midwife

## 2024-01-26 VITALS — BP 121/82 | HR 114 | Wt 229.0 lb

## 2024-01-26 DIAGNOSIS — Z0184 Encounter for antibody response examination: Secondary | ICD-10-CM

## 2024-01-26 DIAGNOSIS — Z3481 Encounter for supervision of other normal pregnancy, first trimester: Secondary | ICD-10-CM | POA: Diagnosis not present

## 2024-01-26 DIAGNOSIS — Z3483 Encounter for supervision of other normal pregnancy, third trimester: Secondary | ICD-10-CM | POA: Insufficient documentation

## 2024-01-26 DIAGNOSIS — Z1379 Encounter for other screening for genetic and chromosomal anomalies: Secondary | ICD-10-CM

## 2024-01-26 DIAGNOSIS — T7589XA Other specified effects of external causes, initial encounter: Secondary | ICD-10-CM

## 2024-01-26 DIAGNOSIS — Z130 Encounter for screening for diseases of the blood and blood-forming organs and certain disorders involving the immune mechanism: Secondary | ICD-10-CM

## 2024-01-26 DIAGNOSIS — Z3A12 12 weeks gestation of pregnancy: Secondary | ICD-10-CM

## 2024-01-26 DIAGNOSIS — Z113 Encounter for screening for infections with a predominantly sexual mode of transmission: Secondary | ICD-10-CM

## 2024-01-26 DIAGNOSIS — Z3491 Encounter for supervision of normal pregnancy, unspecified, first trimester: Secondary | ICD-10-CM | POA: Diagnosis not present

## 2024-01-26 DIAGNOSIS — Z0283 Encounter for blood-alcohol and blood-drug test: Secondary | ICD-10-CM

## 2024-01-26 MED ORDER — ASPIRIN 81 MG PO TBEC: 162.0000 mg | DELAYED_RELEASE_TABLET | Freq: Every day | ORAL | 12 refills | Status: DC

## 2024-01-26 NOTE — Patient Instructions (Signed)
 Prenatal Care Prenatal care is health care you get when pregnant. It helps you and your unborn baby stay as healthy as possible. Start prenatal care early in your pregnancy and continue to go to visits during your pregnancy. Prenatal care may be given by a midwife, a family practice doctor, a Publishing rights manager, physician assistant, or a childbirth and pregnancy doctor. What are the benefits of prenatal care? In prenatal care, your health care provider will get to know your medical history. You'll be checked for conditions that might affect you and your baby. Prenatal care will: Lower the risk for problems as your child grows. Lower certain risks for your baby, especially the risk that: Your child may be born early. Your child will have a low weight at birth. What can I expect at the first prenatal care visit? Your first visit will likely be the longest. You should ask to be seen as soon as you know you're pregnant. The first visit is a good time to talk about any questions or concerns. Make a list of questions to ask your provider at your visits. Medical history At your visit, you and your provider will talk about your medical history, including: Your family's medical history and the medical history of the baby's father. Any past pregnancies and long-term (chronic) health conditions. Any surgeries or procedures you have had. All medicines you're taking. Tell them if you're taking herbs or supplements too. Any tobacco, alcohol, or drug use. Other problems that may harm you and your baby. Tell them if: You need food or housing. You have been around chemicals or radiation. Your partner yells at you, hits you, or hurts you. Tests and screenings Your provider will: Do a physical exam, including a pelvic and breast exam. Do tests to check for: Urinary tract infection (UTI). Sexually transmitted infections (STIs). Low iron levels in your blood. This is called anemia. Blood type and certain  proteins on red blood cells called Rh antibodies. Infections and immunity to viruses, such as hepatitis B and rubella. HIV. Ask your provider if you need to be checked for genetic diseases. Tips about staying healthy Your provider will also give you information about how to keep yourself and your baby healthy, including: Nutrition, vitamins, and food safety. Physical activity. How to treat some problems, such as morning sickness. How to avoid infections and substances that may harm your baby. Caring for your teeth. Work and travel. Problems that require you to call your provider. How often will I have prenatal care visits? After your first prenatal care visit, you will have regular visits throughout your pregnancy. You may visit your provider as follows: Up to week 28 of pregnancy: once every 4 weeks. 28-36 weeks: once every 2 weeks. After 36 weeks: every week until delivery. Some people may have more visits. Others may have fewer. It all depends on your health and that of your baby. Keep all prenatal visits. This is one way for you and your baby to stay as healthy as possible. What happens during routine prenatal care visits? Your provider will: Check your weight and blood pressure. Check your baby's heart sounds. Ask questions about your diet, exercise, sleeping patterns, and whether you can feel the baby move. Ask about any pregnancy symptoms you're having and how you're dealing with them. Tell your provider if: You throw up or feel like you may throw up. You have discharge or you bleed from your vagina. You have trouble pooping (constipation). You have swelling, headaches, or trouble  seeing. You are very tired, or you feel sad and anxious all the time. You have discomfort, including back pain or pain in the pelvis. Tell you problems to watch for during your pregnancy, including signs of labor. Measure the height of your uterus in your belly. This is called fundal height. What  tests might I have during prenatal care visits? You may have blood, urine, and imaging tests. These may include: Urine tests to check for blood sugar, protein, or signs of infection. Genetic testing. Ultrasounds to check your baby's growth, development, and well-being. Your baby may also be checked for congenital conditions. Glucose tests to check for gestational diabetes. This is a form of diabetes that a person can get when pregnant. A test to check for group B strep (GBS) infection. What else can I expect during prenatal care visits? Your provider may give you some vaccines. Getting certain vaccines during pregnancy can protect your baby after birth. These may include: A flu shot. Tdap (tetanus, diphtheria, pertussis) vaccine. A COVID-19 vaccine. A RSV vaccine. Later in your pregnancy, your provider may talk to you about: Childbirth and childbirth classes. Breastfeeding and breastfeeding classes. Birth control after your baby is born. Where to find more information Office on Women's Health: TravelLesson.ca American Pregnancy Association: americanpregnancy.org March of Dimes: marchofdimes.org This information is not intended to replace advice given to you by your health care provider. Make sure you discuss any questions you have with your health care provider. Document Revised: 01/23/2023 Document Reviewed: 01/23/2023 Elsevier Patient Education  2024 [employer].

## 2024-01-26 NOTE — Progress Notes (Signed)
 NEW OB HISTORY AND PHYSICAL  SUBJECTIVE:       Dana Murphy is a 23 y.o. G70P1011 female, Patient's last menstrual period was 10/30/2023., Estimated Date of Delivery: 08/05/24, [redacted]w[redacted]d, presents today for establishment of Prenatal Care. She has no unusual complaints   Pt has hx GDM diet control , forceps del 8lb 10 oz  Relationship:  married  Living spouse and child Work none Exercise none  Alcohol/drugs/Smoking/Vape: vapes , trying to cut back   Gynecologic History Patient's last menstrual period was 10/30/2023. Normal Contraception: none Last Pap: 12/19/2022. Results were: normal  Obstetric History OB History  Gravida Para Term Preterm AB Living  3 1 1  0 1 1  SAB IAB Ectopic Multiple Live Births  0 0 0 0 1    # Outcome Date GA Lbr Len/2nd Weight Sex Type Anes PTL Lv  3 Current           2 Term 11/02/22 [redacted]w[redacted]d 17:10 / 07:10 8 lb 10.3 oz (3.92 kg) F Vag-Forceps EPI  LIV  1 AB 07/21/21 [redacted]w[redacted]d    SAB       Past Medical History:  Diagnosis Date   Family history of diabetes mellitus (DM) 03/14/2022   Gestational diabetes 2024   no medications needed   Gestational diabetes mellitus (GDM) affecting second pregnancy 09/10/2022    Past Surgical History:  Procedure Laterality Date   NO PAST SURGERIES      Current Outpatient Medications on File Prior to Visit  Medication Sig Dispense Refill   Prenatal Multivit-Min-Fe-FA (PRE-NATAL PO) Take 1 tablet by mouth daily.     No current facility-administered medications on file prior to visit.    No Known Allergies  Social History   Socioeconomic History   Marital status: Single    Spouse name: Not on file   Number of children: 0   Years of education: 12   Highest education level: Not on file  Occupational History   Occupation: Physiological scientist  Tobacco Use   Smoking status: Every Day    Current packs/day: 0.00    Average packs/day: 0.5 packs/day for 10.0 years (5.0 ttl pk-yrs)    Types: Cigarettes, E-cigarettes     Start date: 02/19/2012    Last attempt to quit: 02/18/2022    Years since quitting: 1.9   Smokeless tobacco: Never   Tobacco comments:    6-7qd; 02/21/22 - Is day 3 of no smoking.    4/3/30225 - currently vaping, nicotine   Vaping Use   Vaping status: Some Days  Substance and Sexual Activity   Alcohol use: No   Drug use: No   Sexual activity: Yes    Partners: Male    Birth control/protection: None  Other Topics Concern   Not on file  Social History Narrative   Not on file   Social Drivers of Health   Financial Resource Strain: Low Risk  (02/21/2022)   Overall Financial Resource Strain (CARDIA)    Difficulty of Paying Living Expenses: Not very hard  Food Insecurity: Food Insecurity Present (12/25/2023)   Hunger Vital Sign    Worried About Running Out of Food in the Last Year: Sometimes true    Ran Out of Food in the Last Year: Sometimes true  Transportation Needs: No Transportation Needs (12/25/2023)   PRAPARE - Administrator, Civil Service (Medical): No    Lack of Transportation (Non-Medical): No  Physical Activity: Insufficiently Active (02/21/2022)   Exercise Vital Sign    Days  of Exercise per Week: 7 days    Minutes of Exercise per Session: 10 min  Stress: No Stress Concern Present (12/25/2023)   [employer] of Occupational Health - Occupational Stress Questionnaire    Feeling of Stress : Only a little  Social Connections: Moderately Isolated (02/21/2022)   Social Connection and Isolation Panel [NHANES]    Frequency of Communication with Friends and Family: More than three times a week    Frequency of Social Gatherings with Friends and Family: More than three times a week    Attends Religious Services: Never    Database administrator or Organizations: No    Attends Banker Meetings: Never    Marital Status: Living with partner  Intimate Partner Violence: Not At Risk (12/25/2023)   Humiliation, Afraid, Rape, and Kick questionnaire    Fear of  Current or Ex-Partner: No    Emotionally Abused: No    Physically Abused: No    Sexually Abused: No    Family History  Problem Relation Age of Onset   Miscarriages / [place] Mother        had two second trimester losses (> 20wk)   Diabetes Mellitus II Mother    Hypertension Mother    Diabetes Mellitus II Father    Hypertension Father    Lung cancer Maternal Grandfather    Cancer Neg Hx    Thyroid  disease Neg Hx    Hyperlipidemia Neg Hx     The following portions of the patient's history were reviewed and updated as appropriate: allergies, current medications, past OB history, past medical history, past surgical history, past family history, past social history, and problem list.    OBJECTIVE: Initial Physical Exam (New OB)  GENERAL APPEARANCE: alert, well appearing, in no apparent distress, oriented to person, place and time HEAD: normocephalic, atraumatic MOUTH: mucous membranes moist, pharynx normal without lesions THYROID : no thyromegaly or masses present BREASTS: no masses noted, no significant tenderness, no palpable axillary nodes, no skin changes LUNGS: clear to auscultation, no wheezes, rales or rhonchi, symmetric air entry HEART: regular rate and rhythm, no murmurs ABDOMEN: soft, nontender, nondistended, no abnormal masses, no epigastric pain EXTREMITIES: no redness or tenderness in the calves or thighs SKIN: normal coloration and turgor, no rashes LYMPH NODES: no adenopathy palpable NEUROLOGIC: alert, oriented, normal speech, no focal findings or movement disorder noted  PELVIC EXAM not indicated , pap not due. Tested pelvis   ASSESSMENT: Normal pregnancy  PLAN: Prenatal care See ordersNew OB counseling: The patient has been given an overview regarding routine prenatal care. Recommendations regarding diet, weight gain, and exercise in pregnancy were given.Prenatal testing, optional genetic testing, carrier screening, and ultrasound use in pregnancy were  reviewed. Maternit 21 today. Discussed early glucose screen due to history of GDM last pregnancy.  Benefits of Breast Feeding were discussed. The patient is encouraged to consider nursing her baby post partum.  Alise Appl, CNM

## 2024-01-27 LAB — URINALYSIS, ROUTINE W REFLEX MICROSCOPIC
Bilirubin, UA: NEGATIVE
Glucose, UA: NEGATIVE
Nitrite, UA: NEGATIVE
RBC, UA: NEGATIVE
Specific Gravity, UA: 1.029 (ref 1.005–1.030)
Urobilinogen, Ur: 0.2 mg/dL (ref 0.2–1.0)
pH, UA: 5.5 (ref 5.0–7.5)

## 2024-01-27 LAB — CBC/D/PLT+RPR+RH+ABO+RUBIGG...
Antibody Screen: NEGATIVE
Basophils Absolute: 0 10*3/uL (ref 0.0–0.2)
Basos: 0 %
EOS (ABSOLUTE): 0.3 10*3/uL (ref 0.0–0.4)
Eos: 3 %
HCV Ab: NONREACTIVE
HIV Screen 4th Generation wRfx: NONREACTIVE
Hematocrit: 40 % (ref 34.0–46.6)
Hemoglobin: 13.7 g/dL (ref 11.1–15.9)
Hepatitis B Surface Ag: NEGATIVE
Immature Grans (Abs): 0 10*3/uL (ref 0.0–0.1)
Immature Granulocytes: 0 %
Lymphocytes Absolute: 2.3 10*3/uL (ref 0.7–3.1)
Lymphs: 21 %
MCH: 29.6 pg (ref 26.6–33.0)
MCHC: 34.3 g/dL (ref 31.5–35.7)
MCV: 86 fL (ref 79–97)
Monocytes Absolute: 0.5 10*3/uL (ref 0.1–0.9)
Monocytes: 5 %
Neutrophils Absolute: 7.7 10*3/uL — ABNORMAL HIGH (ref 1.4–7.0)
Neutrophils: 71 %
Platelets: 257 10*3/uL (ref 150–450)
RBC: 4.63 x10E6/uL (ref 3.77–5.28)
RDW: 11.8 % (ref 11.7–15.4)
RPR Ser Ql: NONREACTIVE
Rh Factor: POSITIVE
Rubella Antibodies, IGG: 2.21 {index} (ref >0.99)
Varicella zoster IgG: NONREACTIVE
WBC: 10.8 10*3/uL (ref 3.4–10.8)

## 2024-01-27 LAB — MICROSCOPIC EXAMINATION
Casts: NONE SEEN /LPF
Epithelial Cells (non renal): >10 /HPF — AB (ref 0–10)
RBC, Urine: NONE SEEN /HPF (ref 0–2)

## 2024-01-27 LAB — HCV INTERPRETATION

## 2024-01-29 LAB — MONITOR DRUG PROFILE 14(MW)
Amphetamine Scrn, Ur: NEGATIVE ng/mL
BARBITURATE SCREEN URINE: NEGATIVE ng/mL
BENZODIAZEPINE SCREEN, URINE: NEGATIVE ng/mL
Buprenorphine, Urine: NEGATIVE ng/mL
CANNABINOIDS UR QL SCN: NEGATIVE ng/mL
Cocaine (Metab) Scrn, Ur: NEGATIVE ng/mL
Creatinine(Crt), U: 216.7 mg/dL (ref 20.0–300.0)
Fentanyl, Urine: NEGATIVE pg/mL
Meperidine Screen, Urine: NEGATIVE ng/mL
Methadone Screen, Urine: NEGATIVE ng/mL
OXYCODONE+OXYMORPHONE UR QL SCN: NEGATIVE ng/mL
Opiate Scrn, Ur: NEGATIVE ng/mL
Ph of Urine: 5.3 (ref 4.5–8.9)
Phencyclidine Qn, Ur: NEGATIVE ng/mL
Propoxyphene Scrn, Ur: NEGATIVE ng/mL
SPECIFIC GRAVITY: 1.026
Tramadol Screen, Urine: NEGATIVE ng/mL

## 2024-01-29 LAB — NICOTINE SCREEN, URINE: Cotinine Ql Scrn, Ur: POSITIVE ng/mL — AB

## 2024-01-29 LAB — CULTURE, OB URINE

## 2024-01-29 LAB — URINE CULTURE, OB REFLEX

## 2024-01-30 ENCOUNTER — Ambulatory Visit: Admitting: Internal Medicine

## 2024-01-30 ENCOUNTER — Encounter: Payer: Self-pay | Admitting: Family Medicine

## 2024-01-30 ENCOUNTER — Encounter: Payer: Self-pay | Admitting: Internal Medicine

## 2024-01-30 VITALS — BP 130/80 | HR 93 | Temp 98.7°F | Ht 69.0 in | Wt 230.0 lb

## 2024-01-30 DIAGNOSIS — E049 Nontoxic goiter, unspecified: Secondary | ICD-10-CM

## 2024-01-30 DIAGNOSIS — R0683 Snoring: Secondary | ICD-10-CM | POA: Diagnosis not present

## 2024-01-30 DIAGNOSIS — F17290 Nicotine dependence, other tobacco product, uncomplicated: Secondary | ICD-10-CM | POA: Diagnosis not present

## 2024-01-30 DIAGNOSIS — E042 Nontoxic multinodular goiter: Secondary | ICD-10-CM

## 2024-01-30 DIAGNOSIS — G4719 Other hypersomnia: Secondary | ICD-10-CM | POA: Diagnosis not present

## 2024-01-30 DIAGNOSIS — G4733 Obstructive sleep apnea (adult) (pediatric): Secondary | ICD-10-CM

## 2024-01-30 LAB — CERVICOVAGINAL ANCILLARY ONLY
Chlamydia: NEGATIVE
Comment: NEGATIVE
Comment: NORMAL
Neisseria Gonorrhea: NEGATIVE

## 2024-01-30 NOTE — Patient Instructions (Addendum)
 Congratulations on your pregnancy!!!  Please Stop Vaping! High risk for LUNG problems  Avoid Allergens and Irritants Avoid secondhand smoke Avoid SICK contacts Recommend  Masking  when appropriate Recommend Keep up-to-date with vaccinations  Recommend Home Sleep Study to assess for sleep apnea Recommend ENT referral for Thyroid  nodules

## 2024-01-30 NOTE — Progress Notes (Signed)
 Name: Dana Murphy MRN: [id number] DOB: 21-Feb-2001    CHIEF COMPLAINT:  EXCESSIVE DAYTIME SLEEPINESS   HISTORY OF PRESENT ILLNESS: Patient is seen today for problems and issues with sleep related to excessive daytime sleepiness Patient  has been having sleep problems for many years Patient has been having excessive daytime sleepiness for a long time Patient has been having extreme fatigue and tiredness, lack of energy +  very Loud snoring every night + morning headaches + Nonrefreshing sleep  Discussed sleep data and reviewed with patient.  Encouraged proper weight management.  Discussed driving precautions and its relationship with hypersomnolence.  Discussed operating dangerous equipment and its relationship with hypersomnolence.  Discussed sleep hygiene, and benefits of a fixed sleep waked time.  The importance of getting eight or more hours of sleep discussed with patient.  Discussed limiting the use of the computer and television before bedtime.  Decrease naps during the day, so night time sleep will become enhanced.  Limit caffeine, and sleep deprivation.  HTN, stroke, and heart failure are potential risk factors.       01/30/2024   10:00 AM  Results of the Epworth flowsheet  Sitting and reading 3  Watching TV 2  Sitting, inactive in a public place (e.g. a theatre or a meeting) 0  As a passenger in a car for an hour without a break 1  Lying down to rest in the afternoon when circumstances permit 2  Sitting and talking to someone 0  Sitting quietly after a lunch without alcohol 1  In a car, while stopped for a few minutes in traffic 0  Total score 9      History of COVID January 2024 No residual side effects from COVID at this time No exacerbation at this time No evidence of heart failure at this time No evidence or signs of infection at this time No respiratory distress No fevers, chills, nausea, vomiting, diarrhea No evidence of lower extremity edema No  evidence hemoptysis  Patient vapes Nonalcoholic Currently [redacted] weeks pregnant Second child Secondhand smoke exposure    PAST MEDICAL HISTORY :   has a past medical history of Family history of diabetes mellitus (DM) (03/14/2022), Gestational diabetes (2024), and Gestational diabetes mellitus (GDM) affecting second pregnancy (09/10/2022).  has a past surgical history that includes No past surgeries. Prior to Admission medications   Medication Sig Start Date End Date Taking? Authorizing Provider  aspirin EC 81 MG tablet Take 2 tablets (162 mg total) by mouth daily. Swallow whole. Start at 14 -16 weeks pregnancy 01/26/24  Yes Alise Appl, CNM  Prenatal Multivit-Min-Fe-FA (PRE-NATAL PO) Take 1 tablet by mouth daily.   Yes [provider]   No Known Allergies  FAMILY HISTORY:  family history includes Diabetes Mellitus II in her father and mother; Hypertension in her father and mother; Lung cancer in her maternal grandfather; Miscarriages / [place] in her mother. SOCIAL HISTORY:  reports that she has been smoking e-cigarettes and cigarettes. She started smoking about 11 years ago. She has a 5 pack-year smoking history. She has never used smokeless tobacco. She reports that she does not drink alcohol and does not use drugs.   Review of Systems:  Gen:  Denies  fever, sweats, chills weight loss  HEENT: Denies blurred vision, double vision, ear pain, eye pain, hearing loss, nose bleeds, sore throat Cardiac:  No dizziness, chest pain or heaviness, chest tightness,edema, No JVD Resp:   No cough, -sputum production, -shortness of breath,-wheezing, -hemoptysis,  Gi: Denies swallowing difficulty, stomach pain, nausea or vomiting, diarrhea, constipation, bowel incontinence Gu:  Denies bladder incontinence, burning urine Ext:   Denies Joint pain, stiffness or swelling Skin: Denies  skin rash, easy bruising or bleeding or hives Endoc:  Denies polyuria, polydipsia , polyphagia or  weight change Psych:   Denies depression, insomnia or hallucinations  Other:  All other systems negative   ALL OTHER ROS ARE NEGATIVE   BP 130/80 (BP Location: Right Arm, Patient Position: Sitting, Cuff Size: Normal)   Pulse 93   Temp 98.7 F (37.1 C) (Oral)   Ht 5\' 9"  (1.753 m)   Wt 230 lb (104.3 kg)   LMP 10/30/2023   SpO2 97%   BMI 33.97 kg/m     Physical Examination:   General Appearance: No distress  EYES PERRLA, EOM intact.   NECK Supple, No JVD neck swelling , no pain, h/o nodules ORAL CAVITY MALLAMPATI 4 Pulmonary: normal breath sounds, No wheezing.  CardiovascularNormal S1,S2.  No m/r/g.   Abdomen: Benign, Soft, non-tender. Skin:   warm, no rashes, no ecchymosis  Extremities: normal, no cyanosis, clubbing. Neuro:without focal findings,  speech normal  PSYCHIATRIC: Mood, affect within normal limits.   ALL OTHER ROS ARE NEGATIVE    ASSESSMENT AND PLAN SYNOPSIS  Patient with signs and symptoms of excessive daytime sleepiness with probable underlying diagnosis of obstructive sleep apnea in the setting of pregnancy  and deconditioned state   Recommend Sleep Study for definitve diagnosis Recommend home sleep study to assess for sleep apnea  Smoking Assessment and Cessation Counseling Upon further questioning, Patient vapes 1 cartridge per week I have advised patient to quit/stop smoking as soon as possible due to high risk for multiple medical problems Patient is willing to quit smoking  I have advised patient that we can assist and have options of Nicotine replacement therapy. I also advised patient on behavioral therapy and can provide oral medication therapy in conjunction with the other therapies Follow up next Office visit  for assessment of smoking cessation Smoking cessation counseling advised for >10 minutes During pregnancy patient is at high risk for lung problems and lung damage    MEDICATION ADJUSTMENTS/LABS AND TESTS ORDERED: Recommend  Sleep Study Please stop vaping   CURRENT MEDICATIONS REVIEWED AT LENGTH WITH PATIENT TODAY   Patient  satisfied with Plan of action and management. All questions answered  Follow up  3 months   I spent a total of  64 minutes reviewing chart data, face-to-face evaluation with the patient, counseling and coordination of care as detailed above.    Lady Pier, M.D.  [facility]  Medical Director [facility] Medical Director [facility] Cardio-Pulmonary Department

## 2024-02-01 DIAGNOSIS — F330 Major depressive disorder, recurrent, mild: Secondary | ICD-10-CM | POA: Diagnosis not present

## 2024-02-01 DIAGNOSIS — F411 Generalized anxiety disorder: Secondary | ICD-10-CM | POA: Diagnosis not present

## 2024-02-01 LAB — MATERNIT 21 PLUS CORE, BLOOD
Fetal Fraction: 9
Result (T21): NEGATIVE
Trisomy 13 (Patau syndrome): NEGATIVE
Trisomy 18 (Edwards syndrome): NEGATIVE
Trisomy 21 (Down syndrome): NEGATIVE

## 2024-02-08 DIAGNOSIS — F330 Major depressive disorder, recurrent, mild: Secondary | ICD-10-CM | POA: Diagnosis not present

## 2024-02-08 DIAGNOSIS — F411 Generalized anxiety disorder: Secondary | ICD-10-CM | POA: Diagnosis not present

## 2024-02-15 DIAGNOSIS — F330 Major depressive disorder, recurrent, mild: Secondary | ICD-10-CM | POA: Diagnosis not present

## 2024-02-15 DIAGNOSIS — F411 Generalized anxiety disorder: Secondary | ICD-10-CM | POA: Diagnosis not present

## 2024-02-16 ENCOUNTER — Other Ambulatory Visit: Payer: Self-pay

## 2024-02-16 DIAGNOSIS — O09299 Supervision of pregnancy with other poor reproductive or obstetric history, unspecified trimester: Secondary | ICD-10-CM

## 2024-02-22 DIAGNOSIS — F330 Major depressive disorder, recurrent, mild: Secondary | ICD-10-CM | POA: Diagnosis not present

## 2024-02-22 DIAGNOSIS — F411 Generalized anxiety disorder: Secondary | ICD-10-CM | POA: Diagnosis not present

## 2024-02-23 ENCOUNTER — Encounter: Payer: Self-pay | Admitting: Certified Nurse Midwife

## 2024-02-23 ENCOUNTER — Other Ambulatory Visit

## 2024-02-23 ENCOUNTER — Ambulatory Visit (INDEPENDENT_AMBULATORY_CARE_PROVIDER_SITE_OTHER): Admitting: Certified Nurse Midwife

## 2024-02-23 VITALS — BP 106/67 | HR 96 | Wt 227.0 lb

## 2024-02-23 DIAGNOSIS — Z3A16 16 weeks gestation of pregnancy: Secondary | ICD-10-CM

## 2024-02-23 DIAGNOSIS — Z3490 Encounter for supervision of normal pregnancy, unspecified, unspecified trimester: Secondary | ICD-10-CM | POA: Diagnosis not present

## 2024-02-23 DIAGNOSIS — Z1379 Encounter for other screening for genetic and chromosomal anomalies: Secondary | ICD-10-CM

## 2024-02-23 DIAGNOSIS — O09299 Supervision of pregnancy with other poor reproductive or obstetric history, unspecified trimester: Secondary | ICD-10-CM | POA: Diagnosis not present

## 2024-02-23 DIAGNOSIS — Z3689 Encounter for other specified antenatal screening: Secondary | ICD-10-CM

## 2024-02-23 DIAGNOSIS — Z8632 Personal history of gestational diabetes: Secondary | ICD-10-CM | POA: Diagnosis not present

## 2024-02-23 NOTE — Progress Notes (Signed)
    Return Prenatal Note   Assessment/Plan   Plan  23 y.o. G3P1011 at [redacted]w[redacted]d presents for follow-up OB visit. Reviewed prenatal record including previous visit note.  Supervision of normal pregnancy Early one hour GTT and AFP collected today.  Feeling well overall with some lingering morning sickness. Anatomy US  ordered today. Reviewed red flag warning signs anticipatory guidance for upcoming prenatal care.     Orders Placed This Encounter  Procedures   US  OB Comp + 14 Wk    Standing Status:   Future    Expected Date:   03/22/2024    Expiration Date:   05/25/2024    Reason for Exam (SYMPTOM  OR DIAGNOSIS REQUIRED):   anatomy    Preferred Imaging Location?:   [facility]   AFP, Serum, Open Spina Bifida    Is patient insulin dependent?:   No    Weight (lbs):   227    Gestational Age (GA), weeks:   16.4    Date on which patient was at this GA:   02/23/2024    GA Calculation Method:   LMP    GA Date:   08/05/2024    Number of fetuses:   1    Reason for screen:   OTHER             screen    Donor egg?:   N   No follow-ups on file.   Future Appointments  Date Time Provider Department Center  03/22/2024  1:15 PM Forestine Igo, CNM [facility] None  05/17/2024 11:00 AM Cleve Dale, MD [facility] None  01/06/2025 10:00 AM Ma Saupe, MD [facility]    For next visit:  continue with routine prenatal care     Subjective   23 y.o. G3P1011 at [redacted]w[redacted]d presents for this follow-up prenatal visit.  Patient feeling well overall. Has some nausea and vomiting when she first wakes up but resolves quickly.  Patient reports: Movement: Absent Contractions: Not present  Objective   Flow sheet Vitals: Pulse Rate: 96 BP: 106/67 Fetal Heart Rate (bpm): 150 Total weight gain: -3 lb (-1.361 kg)  General Appearance  No acute distress, well appearing, and well nourished Pulmonary   Normal work of breathing Neurologic   Alert and oriented to person, place, and  time Psychiatric   Mood and affect within normal limits  Donato Fu, CNM  05/23/259:12 AM

## 2024-02-23 NOTE — Assessment & Plan Note (Signed)
 Early one hour GTT and AFP collected today.  Feeling well overall with some lingering morning sickness. Anatomy US  ordered today. Reviewed red flag warning signs anticipatory guidance for upcoming prenatal care.

## 2024-02-24 LAB — GLUCOSE, 1 HOUR GESTATIONAL: Gestational Diabetes Screen: 162 mg/dL — ABNORMAL HIGH (ref 70–139)

## 2024-02-27 ENCOUNTER — Other Ambulatory Visit: Payer: Self-pay | Admitting: Certified Nurse Midwife

## 2024-02-27 ENCOUNTER — Ambulatory Visit: Payer: Self-pay | Admitting: Certified Nurse Midwife

## 2024-02-27 DIAGNOSIS — O99810 Abnormal glucose complicating pregnancy: Secondary | ICD-10-CM

## 2024-02-29 LAB — AFP, SERUM, OPEN SPINA BIFIDA
AFP MoM: 0.85
AFP Value: 23.8 ng/mL
Gest. Age on Collection Date: 16.6 wk
Maternal Age At EDD: 25.8 a
OSBR Risk 1 IN: 10000
Test Results:: NEGATIVE
Weight: 227 [lb_av]

## 2024-02-29 NOTE — Progress Notes (Signed)
 Spoke with pt, coming in 5/30 at 8 am for 3 hr GTT. Made aware of fasting protocol

## 2024-03-01 ENCOUNTER — Telehealth: Payer: Self-pay | Admitting: Certified Nurse Midwife

## 2024-03-01 ENCOUNTER — Other Ambulatory Visit

## 2024-03-01 NOTE — Telephone Encounter (Signed)
 Reached out to pt to reschedule 3 hr glucose test that was scheduled on 03/01/2024 at 8:00.  Was able to reschedule the appt for 03/04/2024 at 8:00.

## 2024-03-04 ENCOUNTER — Other Ambulatory Visit

## 2024-03-04 DIAGNOSIS — O99810 Abnormal glucose complicating pregnancy: Secondary | ICD-10-CM | POA: Diagnosis not present

## 2024-03-05 LAB — GESTATIONAL GLUCOSE TOLERANCE
Glucose, Fasting: 80 mg/dL (ref 70–94)
Glucose, GTT - 1 Hour: 133 mg/dL (ref 70–179)
Glucose, GTT - 2 Hour: 116 mg/dL (ref 70–154)
Glucose, GTT - 3 Hour: 96 mg/dL (ref 70–139)

## 2024-03-06 ENCOUNTER — Encounter: Payer: Self-pay | Admitting: Certified Nurse Midwife

## 2024-03-13 DIAGNOSIS — F411 Generalized anxiety disorder: Secondary | ICD-10-CM | POA: Diagnosis not present

## 2024-03-13 DIAGNOSIS — F330 Major depressive disorder, recurrent, mild: Secondary | ICD-10-CM | POA: Diagnosis not present

## 2024-03-15 ENCOUNTER — Ambulatory Visit
Admission: RE | Admit: 2024-03-15 | Discharge: 2024-03-15 | Disposition: A | Discharge status: Home / Self Care | Source: Ambulatory Visit | Attending: Certified Nurse Midwife | Admitting: Certified Nurse Midwife

## 2024-03-15 DIAGNOSIS — Z3689 Encounter for other specified antenatal screening: Secondary | ICD-10-CM | POA: Diagnosis not present

## 2024-03-15 DIAGNOSIS — Z369 Encounter for antenatal screening, unspecified: Secondary | ICD-10-CM | POA: Diagnosis not present

## 2024-03-15 DIAGNOSIS — Z3A19 19 weeks gestation of pregnancy: Secondary | ICD-10-CM | POA: Diagnosis not present

## 2024-03-21 DIAGNOSIS — F330 Major depressive disorder, recurrent, mild: Secondary | ICD-10-CM | POA: Diagnosis not present

## 2024-03-21 DIAGNOSIS — F411 Generalized anxiety disorder: Secondary | ICD-10-CM | POA: Diagnosis not present

## 2024-03-22 ENCOUNTER — Encounter: Admitting: Certified Nurse Midwife

## 2024-03-22 ENCOUNTER — Telehealth: Payer: Self-pay

## 2024-03-22 NOTE — Progress Notes (Signed)
 Complex Care Management Note Care Guide Note  03/22/2024 Name: Dana Murphy MRN: [id number] DOB: 01-15-2001   Complex Care Management Outreach Attempts: An unsuccessful telephone outreach was attempted today to offer the patient information about available complex care management services.  Follow Up Plan:  No further outreach attempts will be made at this time. We have been unable to contact the patient to offer or enroll patient in complex care management services.  Encounter Outcome:  Patient Refused  Creola Doheny [facility], [facility] Guide  Direct Dial: [phone]  Fax [phone]

## 2024-03-25 ENCOUNTER — Ambulatory Visit (INDEPENDENT_AMBULATORY_CARE_PROVIDER_SITE_OTHER): Admitting: Obstetrics

## 2024-03-25 VITALS — BP 108/67 | HR 102 | Wt 228.0 lb

## 2024-03-25 DIAGNOSIS — O99212 Obesity complicating pregnancy, second trimester: Secondary | ICD-10-CM | POA: Diagnosis not present

## 2024-03-25 DIAGNOSIS — O99210 Obesity complicating pregnancy, unspecified trimester: Secondary | ICD-10-CM

## 2024-03-25 DIAGNOSIS — F17290 Nicotine dependence, other tobacco product, uncomplicated: Secondary | ICD-10-CM | POA: Diagnosis not present

## 2024-03-25 DIAGNOSIS — O99332 Smoking (tobacco) complicating pregnancy, second trimester: Secondary | ICD-10-CM | POA: Diagnosis not present

## 2024-03-25 DIAGNOSIS — O09299 Supervision of pregnancy with other poor reproductive or obstetric history, unspecified trimester: Secondary | ICD-10-CM | POA: Insufficient documentation

## 2024-03-25 DIAGNOSIS — Z3A21 21 weeks gestation of pregnancy: Secondary | ICD-10-CM

## 2024-03-25 DIAGNOSIS — O09292 Supervision of pregnancy with other poor reproductive or obstetric history, second trimester: Secondary | ICD-10-CM

## 2024-03-25 DIAGNOSIS — E669 Obesity, unspecified: Secondary | ICD-10-CM

## 2024-03-25 DIAGNOSIS — Z3402 Encounter for supervision of normal first pregnancy, second trimester: Secondary | ICD-10-CM

## 2024-03-25 NOTE — Progress Notes (Signed)
    Return Prenatal Note   Subjective  23 y.o. G3P1011 at [redacted]w[redacted]d presents for this follow-up prenatal visit. Pregnancy notable for elevated BMI, hx of e-cigarette use, hx of A1GDM, and varicella non-immune.   Patient doing well no concerns today. Had anatomy US  on 6/13, final report still not avail in Epic.   Patient reports: Movement: Present Contractions: Not present Denies vaginal bleeding or leaking fluid. Objective  Flow sheet Vitals: Pulse Rate: (!) 102 BP: 108/67 Fundal Height: 21 cm Fetal Heart Rate (bpm): 144 Total weight gain: -2 lb (-0.907 kg)  General Appearance  No acute distress, well appearing, and well nourished Pulmonary   Normal work of breathing Neurologic   Alert and oriented to person, place, and time Psychiatric   Mood and affect within normal limits   Assessment/Plan   Plan  23 y.o. G3P1011 at [redacted]w[redacted]d by LMP=11wk US  presents for follow-up OB visit. Reviewed prenatal record including previous visit note.  1. Encounter for supervision of normal first pregnancy in second trimester (Primary) -Second trimester anticipatory guidance given, round ligament pain discussed -Follow up on anatomy US  report, called [facility] radiology today requesting report  2. Vaping nicotine  dependence, tobacco product -Cessation encouraged  3. History of gestational diabetes in prior pregnancy, currently pregnant -Early 1h = 162; 3hGTT wnl -Will need 3hGTT at 28wks, bypass 1hr screen  4. Obesity in pregnancy, antepartum -Weight gain goal of 11-20 lbs  Return in about 4 weeks (around 04/22/2024) for ROB.   Future Appointments  Date Time Provider Department Center  04/22/2024  1:15 PM Jayne Harlene CROME, CNM [facility] None  05/17/2024 11:00 AM Isaiah Scrivener, MD [facility] None  01/06/2025 10:00 AM Alvia Selinda PARAS, MD [facility]    For next visit:  continue with routine prenatal care    Estil Mangle, DO [facility]

## 2024-03-28 DIAGNOSIS — F411 Generalized anxiety disorder: Secondary | ICD-10-CM | POA: Diagnosis not present

## 2024-03-28 DIAGNOSIS — F330 Major depressive disorder, recurrent, mild: Secondary | ICD-10-CM | POA: Diagnosis not present

## 2024-04-04 DIAGNOSIS — F330 Major depressive disorder, recurrent, mild: Secondary | ICD-10-CM | POA: Diagnosis not present

## 2024-04-18 DIAGNOSIS — F411 Generalized anxiety disorder: Secondary | ICD-10-CM | POA: Diagnosis not present

## 2024-04-18 DIAGNOSIS — F330 Major depressive disorder, recurrent, mild: Secondary | ICD-10-CM | POA: Diagnosis not present

## 2024-04-22 ENCOUNTER — Ambulatory Visit (INDEPENDENT_AMBULATORY_CARE_PROVIDER_SITE_OTHER): Admitting: Certified Nurse Midwife

## 2024-04-22 VITALS — BP 113/77 | HR 92 | Wt 233.7 lb

## 2024-04-22 DIAGNOSIS — Z3480 Encounter for supervision of other normal pregnancy, unspecified trimester: Secondary | ICD-10-CM | POA: Diagnosis not present

## 2024-04-22 DIAGNOSIS — Z3A25 25 weeks gestation of pregnancy: Secondary | ICD-10-CM | POA: Diagnosis not present

## 2024-04-22 DIAGNOSIS — Z114 Encounter for screening for human immunodeficiency virus [HIV]: Secondary | ICD-10-CM

## 2024-04-22 DIAGNOSIS — Z113 Encounter for screening for infections with a predominantly sexual mode of transmission: Secondary | ICD-10-CM

## 2024-04-22 DIAGNOSIS — E042 Nontoxic multinodular goiter: Secondary | ICD-10-CM

## 2024-04-22 DIAGNOSIS — Z369 Encounter for antenatal screening, unspecified: Secondary | ICD-10-CM | POA: Diagnosis not present

## 2024-04-22 DIAGNOSIS — Z131 Encounter for screening for diabetes mellitus: Secondary | ICD-10-CM

## 2024-04-22 NOTE — Progress Notes (Signed)
    Return Prenatal Note   Subjective   23 y.o. G3P1011 at [redacted]w[redacted]d presents for this follow-up prenatal visit.  Patient feeling more tired than in first pregnancy. Has not had thyroid  checked recently, will draw today. Patient reports: Movement: Present Contractions: Not present  Objective   Flow sheet Vitals: Pulse Rate: 92 BP: 113/77 Fundal Height: 25 cm Fetal Heart Rate (bpm): 145 Total weight gain: 3 lb 11.2 oz (1.678 kg)  General Appearance  No acute distress, well appearing, and well nourished Pulmonary   Normal work of breathing Neurologic   Alert and oriented to person, place, and time Psychiatric   Mood and affect within normal limits  Assessment/Plan   Plan  23 y.o. G3P1011 at [redacted]w[redacted]d presents for follow-up OB visit. Reviewed prenatal record including previous visit note.  Supervision of normal pregnancy Reviewed kick counts and preterm labor warning signs. Instructed to call office or come to hospital with persistent headache, vision changes, regular contractions, leaking of fluid, decreased fetal movement or vaginal bleeding. Prepared for 3h GTT, 28w labs for next visit.  Multiple thyroid  nodules TSH, T4, T3 today given increased fatigue, has not had recently checked.      Orders Placed This Encounter  Procedures   Gestational Glucose Tolerance    Standing Status:   Future    Expected Date:   05/20/2024    Expiration Date:   08/18/2024   HIV Antibody (routine testing w rflx)    Standing Status:   Future    Expected Date:   05/20/2024    Expiration Date:   08/18/2024   RPR    Standing Status:   Future    Expected Date:   05/20/2024    Expiration Date:   08/18/2024   TSH+T4F+T3Free   Return in about 4 weeks (around 05/20/2024) for ROB & GDM screening-3 hour glucose tolerance test.   Future Appointments  Date Time Provider Department Center  05/17/2024 11:00 AM Isaiah Scrivener, MD [facility] None  01/06/2025 10:00 AM Alvia Selinda PARAS, MD [facility]    For  next visit:  ROB with 3 hour glucola, third trimester labs, and Tdap     Harlene LITTIE Cisco, CNM  07/21/251:39 PM

## 2024-04-22 NOTE — Assessment & Plan Note (Signed)
 TSH, T4, T3 today given increased fatigue, has not had recently checked.

## 2024-04-22 NOTE — Patient Instructions (Signed)
 Gestational Diabetes Mellitus, Diagnosis Gestational diabetes mellitus, or gestational diabetes, is diabetes that some people get when pregnant. This condition usually happens at 24-28 weeks of pregnancy.  People with gestational diabetes have high blood sugar. If you do not get treated for this condition, it may cause problems for you and your baby. It goes away after you give birth but can happen again the next time you become pregnant. What are the causes? This condition is caused by changes in your body when you're pregnant. When these changes happen: The pancreas may not make enough insulin. The body may not use insulin in the right way. This is called insulin resistance. Insulin helps your body use sugar for energy. If your body doesn't have enough insulin or can't use the insulin that it has, extra sugars stay in your blood. This leads to high blood sugar (hyperglycemia). What increases the risk? Being older than age 51 when pregnant. Too much body weight. Having polycystic ovary syndrome (PCOS). Having someone with diabetes in your family. Having had this condition in the past. Being pregnant with more than one baby. What are the signs or symptoms? You may not have any symptoms. If you do have symptoms, they may include: Being thirsty often. Being hungry often. Needing to pee more often. These symptoms can be missed because they're similar to other symptoms of pregnancy. How is this diagnosed? This condition may be diagnosed based on your blood sugar level and how your body responds to glucose. This may be checked with an oral glucose tolerance test (OGTT). The test may be done: Early in pregnancy if you have risk factors. At 24-28 weeks of your pregnancy. How is this treated? To treat this condition, you may be told to: Eat a healthy diet. Get more exercise. Check your blood sugar often. Your health care provider will tell you what your target is. Take insulin and other  medicines. These are taken if needed. Work with a diabetes expert. Follow these instructions at home: Learn about your diabetes Ask your provider: How often should I check my blood sugar? Where do I get the equipment? What medicines do I need? When should I take them? Do I need to meet with an educator? Who can I call if I have questions? Where can I find a support group? General instructions Take medicines only as told by your provider. Stay at a healthy weight. Drink enough fluid to keep your pee (urine) pale yellow. Check your pee for ketones when sick and as told. Ketones in your pee is a sign that your body is using fat for energy because it's not making enough insulin. Wear an alert bracelet or carry a card that shows you have this condition. Keep all follow-up visits. Your provider needs to check your health and your baby's growth. Where to find more information Gestational Diabetes: American Diabetes Association (ADA): diabetes.org Gestational Diabetes and Pregnancy: Centers for Disease Control and Prevention (CDC): TonerPromos.no American Pregnancy Association: americanpregnancy.org [place] MyPlate: WrestlingReporter.dk Contact a health care provider if:  Your blood sugar is above your target for two tests in a row. You have a high blood sugar level and you also have ketones in your pee. You have been sick or have had a fever for 2 days or more and aren't getting better. You have any of these problems for more than 6 hours: You can't eat or drink. You vomit or feel like you may vomit. You have watery poop (diarrhea). Get help right away if:  You become confused or cannot think clearly. You have trouble breathing. You have chest pain. Your baby is moving less than usual. You have unusual discharge or bleeding from your vagina. You have cramping in your belly or have pain in your hips or lower back. You have symptoms of high blood pressure or preeclampsia. These include: A severe,  throbbing headache that doesn't go away. Sudden or extreme swelling of your face, hands, legs, or feet. Vision problems, such as: Seeing spots. Blurry vision. Sensitivity to light. These symptoms may be an emergency. Get help right away. Call 911. Do not wait to see if the symptoms will go away. Do not drive yourself to the hospital. This information is not intended to replace advice given to you by your health care provider. Make sure you discuss any questions you have with your health care provider. Document Revised: 06/22/2023 Document Reviewed: 12/30/2022 Elsevier Patient Education  2024 Else[employer]. Second Trimester of Pregnancy  The second trimester of pregnancy is from week 14 through week 27. This is months 4 through 6 of pregnancy. During the second trimester: Morning sickness is less or has stopped. You may have more energy. You may feel hungry more often. At this time, your unborn baby is growing very fast. At the end of the sixth month, the unborn baby may be up to 12 inches long and weigh about 1 pounds. You will likely start to feel the baby move between 16 and 20 weeks of pregnancy. Body changes during your second trimester Your body continues to change during this time. The changes usually go away after your baby is born. Physical changes You will gain more weight. Your belly will get bigger. You may begin to get stretch marks on your hips, belly, and breasts. Your breasts will keep growing and may hurt. You may get dark spots or blotches on your face. A dark line from your belly button to the pubic area may appear. This line is called linea nigra. Your hair may grow faster and get thicker. Health changes You may have headaches. You may have heartburn. You may pee more often. You may have swollen, bulging veins (varicose veins). You may have trouble pooping (constipation), or swollen veins in the butt that can itch or get painful (hemorrhoids). You may have back  pain. This is caused by: Weight gain. Pregnancy hormones that are relaxing the joints in your pelvis. Follow these instructions at home: Medicines Talk to your health care provider if you're taking medicines. Ask if the medicines are safe to take during pregnancy. Your provider may change the medicines that you take. Do not take any medicines unless told to by your provider. Take a prenatal vitamin that has at least 600 micrograms (mcg) of folic acid. Do not use herbal medicines, illegal drugs, or medicines that are not approved by your provider. Eating and drinking While you're pregnant your body needs extra food for your growing baby. Talk with your provider about what to eat while pregnant. Activity Most women are able to exercise during pregnancy. Exercises may need to change as your pregnancy goes on. Talk to your provider about your activities and exercise routines. Relieving pain and discomfort Wear a good, supportive bra if your breasts hurt. Rest with your legs raised if you have leg cramps or low back pain. Take warm sitz baths to soothe pain from hemorrhoids. Use hemorrhoid cream if your provider says it's okay. Do not douche. Do not use tampons or scented pads. Do not  use hot tubs, steam rooms, or saunas. Safety Wear your seatbelt at all times when you're in a car. Talk to your provider if someone hits you, hurts you, or yells at you. Talk with your provider if you're feeling sad or have thoughts of hurting yourself. Lifestyle Certain things can be harmful while you're pregnant. It's best to avoid the following: Do not drink alcohol,smoke, vape, or use products with nicotine  or tobacco in them. If you need help quitting, talk with your provider. Avoid cat litter boxes and soil used by cats. These things carry germs that can cause harm to your pregnancy and your baby. General instructions Keep all follow-up visits. It helps you and your unborn baby stay as healthy as  possible. Write down your questions. Take them to your prenatal visits. Your provider will: Talk with you about your overall health. Give you advice or refer you to specialists who can help with different needs, including: Prenatal education classes. Mental health and counseling. Foods and healthy eating. Ask for help if you need help with food. Where to find more information American Pregnancy Association: americanpregnancy.org [employer]ians [employer]necologists: acog.org Office on [employer] Health: TravelLesson.ca Contact a health care provider if: You have a headache that does not go away when you take medicine. You have any of these problems: You can't eat or drink. You throw up or feel like you may throw up. You have watery poop (diarrhea) for 2 days or more. You have pain when you pee or your pee smells bad. You have been sick for 2 days or more and are not getting better. Contact your provider right away if: You have any of these coming from your vagina: Abnormal discharge. Bad-smelling fluid. Bleeding. Your baby is moving less than usual. You have contractions, belly cramping, or have pain in your pelvis or lower back. You have symptoms of high blood pressure or preeclampsia. These include: A severe, throbbing headache that does not go away. Sudden or extreme swelling of your face, hands, legs, or feet. Vision problems: You see spots. You have blurry vision. Your eyes are sensitive to light. If you can't reach the provider, go to an urgent care or emergency room. Get help right away if: You faint, become confused, or can't think clearly. You have chest pain or trouble breathing. You have any kind of injury, such as from a fall or a car crash. These symptoms may be an emergency. Call 911 right away. Do not wait to see if the symptoms will go away. Do not drive yourself to the hospital. This information is not intended to replace advice given to you by  your health care provider. Make sure you discuss any questions you have with your health care provider. Document Revised: 06/22/2023 Document Reviewed: 01/20/2023 Elsevier Patient Education  2024 Elsevier Inc. Early Labor (Pre-Term Labor): What to Know Pregnancy normally lasts 39-41 weeks. Pre-term labor is when labor starts before you have been pregnant for 37 weeks. Babies who are born too early may be at an increased risk for long-term problems like cerebral palsy, developmental delays, and vision and hearing problems. Premature babies may also have problems soon after birth, such as problems with their blood sugar, body temperature, heart, and breathing. These babies often have trouble with feeding. These problems may be very serious in babies who are born before 34 weeks of pregnancy. What are the causes? The cause of pre-term labor is not known. What increases the risk? You're more likely to  have pre-term labor if: You have medical problems, now or in the past. You have problems now or in your past pregnancies. You have risk factors related to lifestyle, environment, and age. Medical history You have problems affecting your uterus, including a short cervix. You have a sexually transmitted infection (STI) or other infections of the urinary tract and the vagina. You have: Blood clotting problems. High blood pressure. High blood sugar. You have a low body weight or too much body weight. Present and past pregnancies You have had pre-term labor before. You're pregnant with more than one baby. The placenta covers your cervix,. This is called placenta previa. Your unborn baby has a congenital condition. This means your baby will be born with the condition. You have bleeding from your vagina while you're pregnant. You became pregnant through in vitro fertilization (IVF). Lifestyle and environmental factors You use drugs, tobacco products or drink alcohol. You have stress and no social  support. You experience domestic violence. You're exposed to certain chemicals at home or work. Other factors You're younger than age 39 or older than age 32. What are the signs or symptoms?  Cramps like those that can happen during a menstrual period. The cramps may happen with diarrhea, which is watery poop. Pain your belly or lower back. Regular contractions. It may feel like your belly is getting tighter. Pressure in your pelvis. Increased watery or bloody discharge from your vagina. Your water breaking. How is this diagnosed? This condition is diagnosed based on: Your medical history and a physical exam. A pelvic exam. An ultrasound. Monitoring for contractions. Other tests, including: A swab of the cervix to check for a protein substance called fetal fibronectin. This protein is usually present in the area between the uterus and the amniotic sac early in pregnancy and then again towards the end. If it's found in the middle of pregnancy, it can sometimes be a sign of pre-term labor. Urine tests. How is this treated? Treatment depends on how far along your pregnancy is, the health of your baby, and your health. Treatment may include: Taking medicines to: Stop contractions. Help the baby's lungs mature if the risk of early delivery is high. Medicines to help prevent your baby from having cerebral palsy or other problems. Bed rest. If the labor happens before 34 weeks of pregnancy, you may need to stay in the hospital. Delivery of the baby. Follow these instructions at home: Do not smoke, vape, or use nicotine  or tobacco. Do not drink alcohol. Take your medicines only as told. Rest as told by your provider. Ask what things are safe for you to do at home. Ask when you can go back to work or school. Keep all follow-up visits. Your provider will need to closely follow your health and the health of your baby. How is this prevented? To increase your chance of having a full-term  pregnancy: Do not use drugs or take medicines that have not been prescribed to you during your pregnancy. Talk with your provider before taking any herbal supplements, even if you have been taking them regularly. Gain a healthy amount of weight during your pregnancy. Watch for infection. If you think that you might have an infection, get it checked right away. Symptoms of infection may include: Fever. Discharge from your vagina that smells bad or is not normal. Pain or burning when you pee. Having to pee small amounts or very often. Blood in your pee. Where to find more information To learn more, go to  these websites: U.S. Department of Health and Cytogeneticist on Women's Health: http://hoffman.com/ [employer]ians [employer]necologists: www.acog.org Centers for Disease Control and Prevention at DiningCalendar.de. Then: Click Search and type preterm labor. Find the link you need. Contact a health care provider if: You think you're going into pre-term labor. You have signs or symptoms of pre-term labor. You have symptoms of infection. Get help right away if: You're having regular, painful contractions every 5 minutes or less. Your water breaks. This information is not intended to replace advice given to you by your health care provider. Make sure you discuss any questions you have with your health care provider. Document Revised: 03/30/2023 Document Reviewed: 03/30/2023 Elsevier Patient Education  2024 [employer].

## 2024-04-22 NOTE — Assessment & Plan Note (Signed)
 Reviewed kick counts and preterm labor warning signs. Instructed to call office or come to hospital with persistent headache, vision changes, regular contractions, leaking of fluid, decreased fetal movement or vaginal bleeding. Prepared for 3h GTT, 28w labs for next visit.

## 2024-04-23 ENCOUNTER — Ambulatory Visit: Payer: Self-pay | Admitting: Certified Nurse Midwife

## 2024-04-23 LAB — TSH+T4F+T3FREE
Free T4: 0.9 ng/dL (ref 0.82–1.77)
T3, Free: 3.3 pg/mL (ref 2.0–4.4)
TSH: 1.18 u[IU]/mL (ref 0.450–4.500)

## 2024-04-25 DIAGNOSIS — F330 Major depressive disorder, recurrent, mild: Secondary | ICD-10-CM | POA: Diagnosis not present

## 2024-05-02 DIAGNOSIS — F330 Major depressive disorder, recurrent, mild: Secondary | ICD-10-CM | POA: Diagnosis not present

## 2024-05-06 ENCOUNTER — Telehealth: Payer: Self-pay

## 2024-05-06 DIAGNOSIS — G4719 Other hypersomnia: Secondary | ICD-10-CM

## 2024-05-06 DIAGNOSIS — G4733 Obstructive sleep apnea (adult) (pediatric): Secondary | ICD-10-CM

## 2024-05-06 NOTE — Telephone Encounter (Signed)
 Copied from CRM [id number]. Topic: Clinical - Medication Question >> May 03, 2024  3:04 PM Rozanna G wrote: Reason for CRM: PT CALLED ABOUT HOME SLEEP STUDY, STATED SHE DID NOT REC THE STUDY AND HER APPT FOR DR ISAIAH IS 08/15 AND SHE WANTS TO KNOW IF IT WAS ORDERED AND WHAT SHE NEEDS TO DO.

## 2024-05-09 DIAGNOSIS — F330 Major depressive disorder, recurrent, mild: Secondary | ICD-10-CM | POA: Diagnosis not present

## 2024-05-16 DIAGNOSIS — F411 Generalized anxiety disorder: Secondary | ICD-10-CM | POA: Diagnosis not present

## 2024-05-16 DIAGNOSIS — F330 Major depressive disorder, recurrent, mild: Secondary | ICD-10-CM | POA: Diagnosis not present

## 2024-05-17 ENCOUNTER — Ambulatory Visit: Admitting: Internal Medicine

## 2024-05-21 ENCOUNTER — Encounter: Admitting: Certified Nurse Midwife

## 2024-05-21 ENCOUNTER — Other Ambulatory Visit

## 2024-05-24 ENCOUNTER — Other Ambulatory Visit: Payer: Self-pay

## 2024-05-24 DIAGNOSIS — Z113 Encounter for screening for infections with a predominantly sexual mode of transmission: Secondary | ICD-10-CM

## 2024-05-24 DIAGNOSIS — D649 Anemia, unspecified: Secondary | ICD-10-CM

## 2024-05-24 DIAGNOSIS — Z131 Encounter for screening for diabetes mellitus: Secondary | ICD-10-CM

## 2024-05-24 DIAGNOSIS — Z3480 Encounter for supervision of other normal pregnancy, unspecified trimester: Secondary | ICD-10-CM

## 2024-05-27 NOTE — Progress Notes (Unsigned)
    Return Prenatal Note   Subjective   23 y.o. G3P1011 at [redacted]w[redacted]d presents for this follow-up prenatal visit.  Patient is doing well. She has no new concerns today. Patient reports: Movement: Present Contractions: Not present  Objective   Flow sheet Vitals: Pulse Rate: (!) 101 BP: 118/68 Fundal Height: 30 cm Fetal Heart Rate (bpm): 130 Total weight gain: 2 lb 14.4 oz (1.315 kg)  General Appearance  No acute distress, well appearing, and well nourished Pulmonary   Normal work of breathing Neurologic   Alert and oriented to person, place, and time Psychiatric   Mood and affect within normal limits  Assessment/Plan   Plan  23 y.o. G3P1011 at [redacted]w[redacted]d presents for follow-up OB visit. Reviewed prenatal record including previous visit note.  Supervision of normal pregnancy Here for 3 ht GTT. Previously had an early elevated one hour with a normal 3 hr.  Feeling well overall. Trying to get things in order to move to a new house once it is ready.  Reviewed kick counts and preterm labor warning signs. Instructed to call office or come to hospital with persistent headache, vision changes, regular contractions, leaking of fluid, decreased fetal movement or vaginal bleeding.       Orders Placed This Encounter  Procedures   Tdap vaccine greater than or equal to 7yo IM   Return in about 4 weeks (around 06/25/2024) for routine prenatal care.   Future Appointments  Date Time Provider Department Center  05/28/2024 11:15 AM Lynett Brasil, Damien, CNM [facility] None  01/06/2025 10:00 AM Alvia Selinda PARAS, MD [facility]    For next visit:  continue with routine prenatal care     Damien Parsley, CNM  [facility] [place] 05/28/2509:58 AM

## 2024-05-28 ENCOUNTER — Encounter: Payer: Self-pay | Admitting: Certified Nurse Midwife

## 2024-05-28 ENCOUNTER — Ambulatory Visit: Admitting: Certified Nurse Midwife

## 2024-05-28 ENCOUNTER — Other Ambulatory Visit

## 2024-05-28 VITALS — BP 118/68 | HR 101 | Wt 232.9 lb

## 2024-05-28 DIAGNOSIS — Z113 Encounter for screening for infections with a predominantly sexual mode of transmission: Secondary | ICD-10-CM

## 2024-05-28 DIAGNOSIS — Z369 Encounter for antenatal screening, unspecified: Secondary | ICD-10-CM | POA: Diagnosis not present

## 2024-05-28 DIAGNOSIS — Z3A30 30 weeks gestation of pregnancy: Secondary | ICD-10-CM | POA: Diagnosis not present

## 2024-05-28 DIAGNOSIS — Z3493 Encounter for supervision of normal pregnancy, unspecified, third trimester: Secondary | ICD-10-CM | POA: Diagnosis not present

## 2024-05-28 DIAGNOSIS — Z3490 Encounter for supervision of normal pregnancy, unspecified, unspecified trimester: Secondary | ICD-10-CM

## 2024-05-28 DIAGNOSIS — Z114 Encounter for screening for human immunodeficiency virus [HIV]: Secondary | ICD-10-CM | POA: Diagnosis not present

## 2024-05-28 DIAGNOSIS — Z23 Encounter for immunization: Secondary | ICD-10-CM | POA: Diagnosis not present

## 2024-05-28 DIAGNOSIS — Z131 Encounter for screening for diabetes mellitus: Secondary | ICD-10-CM | POA: Diagnosis not present

## 2024-05-28 NOTE — Assessment & Plan Note (Signed)
 Here for 3 ht GTT. Previously had an early elevated one hour with a normal 3 hr.  Feeling well overall. Trying to get things in order to move to a new house once it is ready.  Reviewed kick counts and preterm labor warning signs. Instructed to call office or come to hospital with persistent headache, vision changes, regular contractions, leaking of fluid, decreased fetal movement or vaginal bleeding.

## 2024-05-28 NOTE — Patient Instructions (Addendum)
 Tdap (Tetanus, Diphtheria, Pertussis) Vaccine: What You Need to Know Many vaccine information statements are available in Spanish and other languages. See PromoAge.com.br. 1. Why get vaccinated? Tdap vaccine can prevent tetanus, diphtheria, and pertussis. Diphtheria and pertussis spread from person to person. Tetanus enters the body through cuts or wounds. TETANUS (T) causes painful stiffening of the muscles. Tetanus can lead to serious health problems, including being unable to open the mouth, having trouble swallowing and breathing, or death. DIPHTHERIA (D) can lead to difficulty breathing, heart failure, paralysis, or death. PERTUSSIS (aP), also known as whooping cough, can cause uncontrollable, violent coughing that makes it hard to breathe, eat, or drink. Pertussis can be extremely serious especially in babies and young children, causing pneumonia, convulsions, brain damage, or death. In teens and adults, it can cause weight loss, loss of bladder control, passing out, and rib fractures from severe coughing. 2. Tdap vaccine Tdap is only for children 7 years and older, adolescents, and adults.  Adolescents should receive a single dose of Tdap, preferably at age 28 or 12 years. Pregnant people should get a dose of Tdap during every pregnancy, preferably during the early part of the third trimester, to help protect the newborn from pertussis. Infants are most at risk for severe, life-threatening complications from pertussis. Adults who have never received Tdap should get a dose of Tdap. Also, adults should receive a booster dose of either Tdap or Td (a different vaccine that protects against tetanus and diphtheria but not pertussis) every 10 years, or after 5 years in the case of a severe or dirty wound or burn. Tdap may be given at the same time as other vaccines. 3. Talk with your health care provider Tell your vaccine provider if the person getting the vaccine: Has had an allergic  reaction after a previous dose of any vaccine that protects against tetanus, diphtheria, or pertussis, or has any severe, life-threatening allergies Has had a coma, decreased level of consciousness, or prolonged seizures within 7 days after a previous dose of any pertussis vaccine (DTP, DTaP, or Tdap) Has seizures or another nervous system problem Has ever had Guillain-Barr Syndrome (also called GBS) Has had severe pain or swelling after a previous dose of any vaccine that protects against tetanus or diphtheria In some cases, your health care provider may decide to postpone Tdap vaccination until a future visit. People with minor illnesses, such as a cold, may be vaccinated. People who are moderately or severely ill should usually wait until they recover before getting Tdap vaccine.  Your health care provider can give you more information. 4. Risks of a vaccine reaction Pain, redness, or swelling where the shot was given, mild fever, headache, feeling tired, and nausea, vomiting, diarrhea, or stomachache sometimes happen after Tdap vaccination. People sometimes faint after medical procedures, including vaccination. Tell your provider if you feel dizzy or have vision changes or ringing in the ears.  As with any medicine, there is a very remote chance of a vaccine causing a severe allergic reaction, other serious injury, or death. 5. What if there is a serious problem? An allergic reaction could occur after the vaccinated person leaves the clinic. If you see signs of a severe allergic reaction (hives, swelling of the face and throat, difficulty breathing, a fast heartbeat, dizziness, or weakness), call 9-1-1 and get the person to the nearest hospital. For other signs that concern you, call your health care provider.  Adverse reactions should be reported to the Vaccine Adverse Event Reporting  System (VAERS). Your health care provider will usually file this report, or you can do it yourself. Visit the  VAERS website at www.vaers.LAgents.no or call 408-456-8982. VAERS is only for reporting reactions, and VAERS staff members do not give medical advice. 6. The National Vaccine Injury Compensation Program The [employer] Vaccine Injury Compensation Program (VICP) is a federal program that was created to compensate people who may have been injured by certain vaccines. Claims regarding alleged injury or death due to vaccination have a time limit for filing, which may be as short as two years. Visit the VICP website at SpiritualWord.at or call 6163105649 to learn about the program and about filing a claim. 7. How can I learn more? Ask your health care provider. Call your local or state health department. Visit the website of the Food and Drug Administration (FDA) for vaccine package inserts and additional information at FinderList.no. Contact the Centers for Disease Control and Prevention (CDC): Call 423-419-2016 (1-800-CDC-INFO) or Visit CDC's website at PicCapture.uy. Source: CDC Vaccine Information Statement Tdap (Tetanus, Diphtheria, Pertussis) Vaccine (05/08/2020) This same material is available at FootballExhibition.com.br for no charge. This information is not intended to replace advice given to you by your health care provider. Make sure you discuss any questions you have with your health care provider. Document Revised: 01/04/2023 Document Reviewed: 11/04/2022 Elsevier Patient Education  2024 Else[employer] Inc.Fetal Movement Counts When you're pregnant, you might start feeling your baby move around the middle of your pregnancy. At first, these movements might feel like flutters, rolls, or swishes. As your baby grows, you might feel more kicks and jabs. Around week 28 of your pregnancy, your health care team may ask you to count how often your baby moves. This is important for all pregnancies, but especially for high-risk ones. Counting movements can help  lessen the risk of stillbirth. What is a fetal movement count? A fetal movement count is the number of times that you feel your baby move during a certain amount of time. This may also be called a kick count. There are many ways to do a kick count. Ask your team what is best for you. Pay attention to when your baby is most active. You may notice your baby's sleep and wake cycles. You may also notice things that make your baby move more. When you do a kick count, try to do it: When your baby is normally most active. At the same time each day. How do I count fetal movements?  Find a quiet, comfortable area. Sit or lie down. Write down the date, the start time, and the number of movements you feel. Count kicks, flutters, swishes, rolls, and jabs. Usually, you will feel at least 10 movements within 2 hours. Stop counting after you have felt 10 movements or if you have been counting for 2 hours. Write down the stop time. Contact a health care provider if: You don't feel 10 movements in 2 hours. Your baby isn't moving as it usually does. Your baby isn't moving at all. If you're not able to reach your provider, go to an emergency room. This information is not intended to replace advice given to you by your health care provider. Make sure you discuss any questions you have with your health care provider. Document Revised: 10/13/2023 Document Reviewed: 10/05/2022 Elsevier Patient Education  2025 [employer]. Third Trimester of Pregnancy  The third trimester of pregnancy is from week 28 through week 40. This is months 7 through 9. The third trimester is a  time when your baby is growing fast. Body changes during your third trimester Your body continues to change during this time. The changes usually go away after your baby is born. Physical changes You will continue to gain weight. You may get stretch marks on your hips, belly, and breasts. Your breasts will keep growing and may hurt. A yellow  fluid (colostrum) may leak from your breasts. This is the first milk you're making for your baby. Your hair may grow faster and get thicker. In some cases, you may get hair loss. Your belly button may stick out. You may have more swelling in your hands, face, or ankles. Health changes You may have heartburn. You may feel short of breath. This is caused by the uterus that is now bigger. You may have more aches in the pelvis, back, or thighs. You may have more tingling or numbness in your hands, arms, and legs. You may pee more often. You may have trouble pooping (constipation) or swollen veins in the butt that can itch or get painful (hemorrhoids). Other changes You may have more problems sleeping. You may notice the baby moving lower in your belly (dropping). You may have more fluid coming from your vagina. Your joints may feel loose, and you may have pain around your pelvic bone. Follow these instructions at home: Medicines Take medicines only as told by your health care provider. Some medicines are not safe during pregnancy. Your provider may change the medicines that you take. Do not take any medicines unless told to by your provider. Take a prenatal vitamin that has at least 600 micrograms (mcg) of folic acid. Do not use herbal medicines, illegal drugs, or medicines that are not approved by your provider. Eating and drinking While you're pregnant your body needs additional nutrition to help support your growing baby. Talk with your provider about your nutritional needs. Activity Most women are able to exercise regularly during pregnancy. Exercise routines may need to change at the end of your pregnancy. Talk to your provider about your activities and exercise routine. Relieving pain and discomfort Rest often with your legs raised if you have leg cramps or low back pain. Take warm sitz baths to soothe pain from hemorrhoids. Use hemorrhoid cream if your provider says it's okay. Wear  a good, supportive bra if your breasts hurt. Do not use hot tubs, steam rooms, or saunas. Do not douche. Do not use tampons or scented pads. Safety Talk to your provider before traveling far distances. Wear your seatbelt at all times when you're in a car. Talk to your provider if someone hits you, hurts you, or yells at you. Preparing for birth To prepare for your baby: Take childbirth and breastfeeding classes. Visit the hospital and tour the maternity area. Buy a rear-facing car seat. Learn how to install it in your car. General instructions Avoid cat litter boxes and soil used by cats. These things carry germs that can cause harm to your pregnancy and your baby. Do not drink alcohol, smoke, vape, or use products with nicotine  or tobacco in them. If you need help quitting, talk with your provider. Keep all follow-up visits for your third trimester. Your provider will do more exams and tests during this trimester. Write down your questions. Take them to your prenatal visits. Your provider also will: Talk with you about your overall health. Give you advice or refer you to specialists who can help with different needs, including: Mental health and counseling. Foods and healthy eating. Ask  for help if you need help with food. Where to find more information American Pregnancy Association: americanpregnancy.org [employer]etricians and Gynecologists: acog.org Office on [employer] Health: TravelLesson.ca Contact a health care provider if: You have a headache that does not go away when you take medicine. You have any of these problems: You can't eat or drink. You have nausea and vomiting. You have watery poop (diarrhea) for 2 days or more. You have pain when you pee, or your pee smells bad. You have been sick for 2 days or more and aren't getting better. Contact your provider right away if: You have any of these coming from your vagina: Abnormal discharge. Bad-smelling  fluid. Bleeding. Your baby is moving less than usual. You have signs of labor: You have any contractions, belly cramping, or have pain in your pelvis or lower back before 37 weeks of pregnancy (preterm labor). You have regular contractions that are less than 5 minutes apart. Your water breaks. You have symptoms of high blood pressure or preeclampsia. These include: A severe, throbbing headache that does not go away. Sudden or extreme swelling of your face, hands, legs, or feet. Vision problems: You see spots. You have blurry vision. Your eyes are sensitive to light. If you can't reach your provider, go to an urgent care or emergency room. Get help right away if: You faint, become confused, or can't think clearly. You have chest pain or trouble breathing. You have any kind of injury, such as from a fall or a car crash. These symptoms may be an emergency. Call 911 right away. Do not wait to see if the symptoms will go away. Do not drive yourself to the hospital. This information is not intended to replace advice given to you by your health care provider. Make sure you discuss any questions you have with your health care provider. Document Revised: 06/22/2023 Document Reviewed: 01/20/2023 Elsevier Patient Education  2024 [employer].

## 2024-05-29 LAB — GESTATIONAL GLUCOSE TOLERANCE
Glucose, Fasting: 81 mg/dL (ref 70–94)
Glucose, GTT - 1 Hour: 139 mg/dL (ref 70–179)
Glucose, GTT - 2 Hour: 136 mg/dL (ref 70–154)
Glucose, GTT - 3 Hour: 46 mg/dL — ABNORMAL LOW (ref 70–139)

## 2024-05-29 LAB — HIV ANTIBODY (ROUTINE TESTING W REFLEX): HIV Screen 4th Generation wRfx: NONREACTIVE

## 2024-05-29 LAB — RPR: RPR Ser Ql: NONREACTIVE

## 2024-05-31 DIAGNOSIS — Z3483 Encounter for supervision of other normal pregnancy, third trimester: Secondary | ICD-10-CM | POA: Diagnosis not present

## 2024-05-31 DIAGNOSIS — Z3482 Encounter for supervision of other normal pregnancy, second trimester: Secondary | ICD-10-CM | POA: Diagnosis not present

## 2024-06-06 DIAGNOSIS — F330 Major depressive disorder, recurrent, mild: Secondary | ICD-10-CM | POA: Diagnosis not present

## 2024-06-06 DIAGNOSIS — F411 Generalized anxiety disorder: Secondary | ICD-10-CM | POA: Diagnosis not present

## 2024-06-11 ENCOUNTER — Encounter: Payer: Self-pay | Admitting: Licensed Practical Nurse

## 2024-06-11 ENCOUNTER — Ambulatory Visit: Admitting: Licensed Practical Nurse

## 2024-06-11 VITALS — BP 102/55 | HR 101 | Wt 234.1 lb

## 2024-06-11 DIAGNOSIS — Z3A32 32 weeks gestation of pregnancy: Secondary | ICD-10-CM | POA: Diagnosis not present

## 2024-06-11 DIAGNOSIS — Z23 Encounter for immunization: Secondary | ICD-10-CM | POA: Diagnosis not present

## 2024-06-11 DIAGNOSIS — Z2911 Encounter for prophylactic immunotherapy for respiratory syncytial virus (RSV): Secondary | ICD-10-CM

## 2024-06-11 DIAGNOSIS — Z3403 Encounter for supervision of normal first pregnancy, third trimester: Secondary | ICD-10-CM | POA: Diagnosis not present

## 2024-06-11 NOTE — Progress Notes (Signed)
    Return Prenatal Note   Subjective   23 y.o. G3P1011 at [redacted]w[redacted]d presents for this follow-up prenatal visit.  Patient her with daughter  Patient reports:Having normal third trimester discomforts,feeling tired and achy, pelvic pain. Mood has been good.  -Desires IUD would like a few years between potential pregnancies   Movement: Present Contractions: Not present  Objective   Flow sheet Vitals: Pulse Rate: (!) 101 BP: (!) 102/55 Fundal Height: 33 cm Fetal Heart Rate (bpm): 135 Total weight gain: 4 lb 1.6 oz (1.86 kg)  General Appearance  No acute distress, well appearing, and well nourished Pulmonary   Normal work of breathing Neurologic   Alert and oriented to person, place, and time Psychiatric   Mood and affect within normal limits   Assessment/Plan   Plan  23 y.o. G3P1011 at [redacted]w[redacted]d presents for follow-up OB visit. Reviewed prenatal record including previous visit note.  Supervision of normal pregnancy -TWG 4 lbs, which is ok -Has family to care for her daughter while in labor  -Breastfed x 14-15 months, plans to BF again -RSV vaccine given -warning signs reviewed      Orders Placed This Encounter  Procedures   Respiratory syncytial virus vaccine, preF, subunit, bivalent,(Abrysvo)   Return in about 2 weeks (around 06/25/2024) for ROB.   Future Appointments  Date Time Provider Department Center  06/25/2024 10:15 AM Jayne Harlene CROME, CNM [facility] None  01/06/2025 10:00 AM Alvia Selinda PARAS, MD [facility] [phone] [place]    For next visit:  continue with routine prenatal care     [facility], CNM  06/11/2509:53 AM

## 2024-06-11 NOTE — Assessment & Plan Note (Signed)
-  TWG 4 lbs, which is ok -Has family to care for her daughter while in labor  -Breastfed x 14-15 months, plans to BF again -RSV vaccine given -warning signs reviewed

## 2024-06-13 DIAGNOSIS — F330 Major depressive disorder, recurrent, mild: Secondary | ICD-10-CM | POA: Diagnosis not present

## 2024-06-20 DIAGNOSIS — F330 Major depressive disorder, recurrent, mild: Secondary | ICD-10-CM | POA: Diagnosis not present

## 2024-06-24 NOTE — Progress Notes (Unsigned)
    Return Prenatal Note   Subjective   23 y.o. G3P1011 at [redacted]w[redacted]d presents for this follow-up prenatal visit.  Patient feeling well, active baby. Moving Saturday. BP initially low, denies dizziness, visual changes, elevated heart rate. Reports pelvic pain with standing at times. Patient reports: Movement: Present Contractions: Not present  Objective   Flow sheet Vitals: Pulse Rate: 89 BP: (!) 109/51 Fundal Height: 35 cm Fetal Heart Rate (bpm): 140 Total weight gain: 5 lb (2.268 kg)  General Appearance  No acute distress, well appearing, and well nourished Pulmonary   Normal work of breathing Neurologic   Alert and oriented to person, place, and time Psychiatric   Mood and affect within normal limits   Assessment/Plan   Plan  23 y.o. G3P1011 at [redacted]w[redacted]d presents for follow-up OB visit. Reviewed prenatal record including previous visit note.  Supervision of normal pregnancy Reviewed kick counts and preterm labor warning signs. Instructed to call office or come to hospital with persistent headache, vision changes, regular contractions, leaking of fluid, decreased fetal movement or vaginal bleeding. Prepared for GBS/Gc/Ct at next visit.  Discussed ordering support belt, [employer] website sent as she ordered breast pump there as well. Growth ultrasound ordered.    Orders Placed This Encounter  Procedures   US  OB Follow Up    Standing Status:   Future    Expected Date:   06/26/2024    Expiration Date:   06/25/2025    Reason for exam::   growth    Preferred imaging location?:   Internal   Return in 2 weeks (on 07/09/2024) for ROB.   Future Appointments  Date Time Provider Department Center  06/27/2024 11:15 AM [facility] US  1 [facility] None  07/09/2024 11:15 AM Charma Domino, CNM [facility] None  01/06/2025 10:00 AM Alvia Selinda PARAS, MD [facility] [phone] [place]    For next visit:  continue with routine prenatal care     Harlene LITTIE Cisco, CNM  06/25/2509:58 AM

## 2024-06-25 ENCOUNTER — Ambulatory Visit (INDEPENDENT_AMBULATORY_CARE_PROVIDER_SITE_OTHER): Admitting: Certified Nurse Midwife

## 2024-06-25 ENCOUNTER — Encounter: Payer: Self-pay | Admitting: Certified Nurse Midwife

## 2024-06-25 VITALS — BP 109/51 | HR 89 | Wt 235.0 lb

## 2024-06-25 DIAGNOSIS — Z3A34 34 weeks gestation of pregnancy: Secondary | ICD-10-CM

## 2024-06-25 DIAGNOSIS — Z3483 Encounter for supervision of other normal pregnancy, third trimester: Secondary | ICD-10-CM

## 2024-06-25 DIAGNOSIS — O99210 Obesity complicating pregnancy, unspecified trimester: Secondary | ICD-10-CM

## 2024-06-25 NOTE — Assessment & Plan Note (Signed)
 Reviewed kick counts and preterm labor warning signs. Instructed to call office or come to hospital with persistent headache, vision changes, regular contractions, leaking of fluid, decreased fetal movement or vaginal bleeding. Prepared for GBS/Gc/Ct at next visit.

## 2024-06-25 NOTE — Patient Instructions (Signed)
 Group B Streptococcus Infection During Pregnancy Group B Streptococcus (GBS) is a type of bacteria that is often found in healthy people. It is commonly found in the rectum, vagina, and intestines. In people who are healthy and not pregnant, the bacteria rarely cause serious illness or complications. However, women who test positive for GBS during pregnancy can pass the bacteria to the baby during childbirth. This can cause serious infection in the baby after birth. Women with GBS may also have infections during their pregnancy or soon after childbirth. The infections include urinary tract infections (UTIs) or infections of the uterus. GBS also increases a woman's risk of complications during pregnancy, such as early labor or delivery, miscarriage, or stillbirth. Routine testing for GBS is recommended for all pregnant women. What are the causes? This condition is caused by bacteria called Streptococcus agalactiae. What increases the risk? You may have a higher risk for GBS infection during pregnancy if you had one during a past pregnancy. What are the signs or symptoms? In most cases, GBS infection does not cause symptoms in pregnant women. If symptoms exist, they may include: Labor that starts before the 37th week of pregnancy. A UTI or bladder infection. This may cause a fever, frequent urination, or pain and burning during urination. Fever during labor. There can also be a rapid heartbeat in the mother or baby. Rare but serious symptoms of a GBS infection in women include: Blood infection (septicemia). This may cause fever, chills, or confusion. Lung infection (pneumonia). This may cause fever, chills, cough, rapid breathing, chest pain, or difficulty breathing. Bone, joint, skin, or soft tissue infection. How is this diagnosed? You may be screened for GBS between week 35 and week 37 of pregnancy. If you have symptoms of preterm labor, you may be screened earlier. This condition is diagnosed  based on lab test results from: A swab of fluid from the vagina and rectum. A urine sample. How is this treated? This condition is treated with antibiotic medicine. Antibiotic medicine may be given: To you when you go into labor, or as soon as your water breaks. The medicines will continue until after you give birth. If you are having a cesarean delivery, you do not need antibiotics unless your water has broken. To your baby, if he or she requires treatment. Your health care provider will check your baby to decide if he or she needs antibiotics to prevent a serious infection. Follow these instructions at home: Take over-the-counter and prescription medicines only as told by your health care provider. Take your antibiotic medicine as told by your health care provider. Do not stop taking the antibiotic even if you start to feel better. Keep all pre-birth (prenatal) visits and follow-up visits as told by your health care provider. This is important. Contact a health care provider if: You have pain or burning when you urinate. You have to urinate more often than usual. You have a fever or chills. You develop a bad-smelling vaginal discharge. Get help right away if: Your water breaks. You go into labor. You have severe pain in your abdomen. You have difficulty breathing. You have chest pain. These symptoms may represent a serious problem that is an emergency. Do not wait to see if the symptoms will go away. Get medical help right away. Call your local emergency services (911 in the [place] Do not drive yourself to the hospital. Summary GBS is a type of bacteria that is common in healthy people. During pregnancy, colonization with GBS can cause  serious complications for you or your baby. Your health care provider will screen you between 35 and 37 weeks of pregnancy to determine if you are colonized with GBS. If you are colonized with GBS during pregnancy, your health care provider will recommend  antibiotics through an IV during labor. After delivery, your baby will be evaluated for complications related to potential GBS infection and may require antibiotics to prevent a serious infection. This information is not intended to replace advice given to you by your health care provider. Make sure you discuss any questions you have with your health care provider. Document Revised: 09/05/2022 Document Reviewed: 09/05/2022 Elsevier Patient Education  2024 Elsevier Inc. Problems to Watch for During Pregnancy During pregnancy, your body goes through many changes. Some changes may be uncomfortable. But most changes are not a serious problem. It's important to learn when certain signs and symptoms may be a problem. Talk with your health care provider about any medical conditions you have. Make sure you know the symptoms to watch for. Reporting problems early will prevent complications. Problems to watch for during pregnancy You're more likely to get an infection during pregnancy. Let your provider know if you have signs of infection, such as: A fever. A bad-smelling fluid from your vagina. Peeing too often, wanting to pee urgently, or pain when you pee. Also, let your provider know if: You're very tired, you feel dizzy, or you faint. You have watery poop (diarrhea) for 24 hours or longer. You throw up or feel like throwing up for 24 hours or longer. You have cramping in your belly or have pain in your hips or lower back. You have spotting, bleeding, or leaking of fluid from your vagina. You have pain, swelling, or redness in an arm or leg. You should also watch for signs of high blood pressure and preeclampsia. These signs can be very serious. They include: A headache that doesn't go away when you take medicine. Sudden or very bad swelling of your face, hands, legs, or feet. Problems seeing, such as: You see spots. You have blurry vision. You may be sensitive to light. Why it's important to  watch for these problems Watching and reporting problems to your provider can help prevent complications that may affect you and your baby. These include: Higher risk of giving birth early. Infection that may be passed on to your baby. Higher risk for stillbirth. Follow these instructions at home:  Take your medicines only as told. Keep all follow-up visits. Your provider needs to monitor your health and your baby's health. Where to find more information To learn more, go to these websites: Centers for Disease Control and Prevention (CDC) at TonerProm[employer].no. Then: Click Health Topics A-Z. Type urgent maternal warning signs in the search box. [employer] (ACOG): acog.org Contact a health care provider if: You have any problems while you're pregnant. You feel your baby moving less than usual. You have any of these things: You have strong emotions, such as sadness or anxiety, that affect your daily life. You do not feel safe in your home. You use tobacco, alcohol, or drugs, and you need help to stop. Get help right away if: You faint, have a seizure, or cannot think clearly. You have chest pain or difficulty breathing. You have any of the following symptoms and you were unable to reach your provider: You have symptoms of infection, including a fever, or have vaginal bleeding. You have symptoms of high blood pressure or preeclampsia. You  have signs or symptoms of labor before 37 weeks of pregnancy. These include: Contractions that are 5 minutes or less apart, or that increase in frequency, intensity, or length. Sudden, sharp pain in the belly, or low back pain. Any amount of fluid that flows from your vagina without stopping. These symptoms may be an emergency. Call 911 right away. Do not wait to see if the symptoms will go away. Do not drive yourself to the hospital. This information is not intended to replace advice given to you by your health care  provider. Make sure you discuss any questions you have with your health care provider. Document Revised: 02/28/2023 Document Reviewed: 02/28/2023 Elsevier Patient Education  2024 Elsevier Inc. Signs and Symptoms of Labor Labor is the body's natural process of moving the baby and the placenta out of the uterus. The process of labor usually starts when the baby is full-term, between 37 and 41 weeks of pregnancy. Signs and symptoms that you are close to going into labor As your body prepares for labor and the birth of your baby, you may notice the following symptoms in the weeks and days before true labor starts: Passing a small amount of thick, bloody mucus from your vagina. This is called normal bloody show or losing your mucus plug. This may happen more than a week before labor begins, or right before labor begins, as the opening of the cervix starts to widen (dilate). For some women, the entire mucus plug passes at once. For others, pieces of the mucus plug may gradually pass over several days. Your baby moving (dropping) lower in your pelvis to get into position for birth (lightening). When this happens, you may feel more pressure on your bladder and pelvic bone and less pressure on your ribs. This may make it easier to breathe. It may also cause you to need to urinate more often and have problems with bowel movements. Having practice contractions, also called Braxton Hicks contractions or false labor. These occur at irregular (unevenly spaced) intervals that are more than 10 minutes apart. False labor contractions are common after exercise or sexual activity. They will stop if you change position, rest, or drink fluids. These contractions are usually mild and do not get stronger over time. They may feel like: A backache or back pain. Mild cramps, similar to menstrual cramps. Tightening or pressure in your abdomen. Other early symptoms include: Nausea or loss of appetite. Diarrhea. Having a  sudden burst of energy, or feeling very tired. Mood changes. Having trouble sleeping. Signs and symptoms that labor has begun Signs that you are in labor may include: Having contractions that come at regular (evenly spaced) intervals and increase in intensity. This may feel like more intense tightening or pressure in your abdomen that moves to your back. Contractions may also feel like rhythmic pain in your upper thighs or back that comes and goes at regular intervals. If you are delivering for the first time, this change in intensity of contractions often occurs at a more gradual pace. If you have given birth before, you may notice a more rapid progression of contraction changes. Feeling pressure in the vaginal area. Your water breaking (rupture of membranes). This is when the sac of fluid that surrounds your baby breaks. Fluid leaking from your vagina may be clear or blood-tinged. Labor usually starts within 24 hours of your water breaking, but it may take longer to begin. Some people may feel a sudden gush of fluid; others may notice repeatedly damp  underwear. Follow these instructions at home:  When labor starts, or if your water breaks, call your health care provider or nurse care line. Based on your situation, they will determine when you should go in for an exam. During early labor, you may be able to rest and manage symptoms at home. Some strategies to try at home include: Breathing and relaxation techniques. Taking a warm bath or shower. Listening to music. Using a heating pad on the lower back for pain. If directed, apply heat to the area as often as told by your health care provider. Use the heat source that your health care provider recommends, such as a moist heat pack or a heating pad. Place a towel between your skin and the heat source. Leave the heat on for 20-30 minutes. Remove the heat if your skin turns bright red. This is especially important if you are unable to feel pain,  heat, or cold. You have a greater risk of getting burned. Contact a health care provider if: Your labor has started. Your water breaks. You have nausea, vomiting, or diarrhea. Get help right away if: You have painful, regular contractions that are 5 minutes apart or less. Labor starts before you are [redacted] weeks along in your pregnancy. You have a fever. You have bright red blood coming from your vagina. You do not feel your baby moving. You have a severe headache with or without vision problems. You have chest pain or shortness of breath. These symptoms may represent a serious problem that is an emergency. Do not wait to see if the symptoms will go away. Get medical help right away. Call your local emergency services (911 in the [place] Do not drive yourself to the hospital. Summary Labor is your body's natural process of moving your baby and the placenta out of your uterus. The process of labor usually starts when your baby is full-term, between 38 and 40 weeks of pregnancy. When labor starts, or if your water breaks, call your health care provider or nurse care line. Based on your situation, they will determine when you should go in for an exam. This information is not intended to replace advice given to you by your health care provider. Make sure you discuss any questions you have with your health care provider. Document Revised: 02/02/2021 Document Reviewed: 02/02/2021 Elsevier Patient Education  2024 [employer].

## 2024-06-27 ENCOUNTER — Other Ambulatory Visit

## 2024-06-27 DIAGNOSIS — Z3A34 34 weeks gestation of pregnancy: Secondary | ICD-10-CM

## 2024-06-27 DIAGNOSIS — E669 Obesity, unspecified: Secondary | ICD-10-CM

## 2024-06-27 DIAGNOSIS — O99210 Obesity complicating pregnancy, unspecified trimester: Secondary | ICD-10-CM | POA: Diagnosis not present

## 2024-07-04 DIAGNOSIS — F330 Major depressive disorder, recurrent, mild: Secondary | ICD-10-CM | POA: Diagnosis not present

## 2024-07-04 DIAGNOSIS — F411 Generalized anxiety disorder: Secondary | ICD-10-CM | POA: Diagnosis not present

## 2024-07-08 NOTE — Assessment & Plan Note (Deleted)
 GBS today. BSUS to confirm presentation today. RTC weekly.

## 2024-07-08 NOTE — Progress Notes (Deleted)
    Return Prenatal Note   Subjective   23 y.o. G3P1011 at 104w0d presents for this follow-up prenatal visit.  Patient *** Patient reports:    Objective   Flow sheet Vitals:   Total weight gain: 5 lb (2.268 kg)  General Appearance  No acute distress, well appearing, and well nourished Pulmonary   Normal work of breathing Neurologic   Alert and oriented to person, place, and time Psychiatric   Mood and affect within normal limits   Assessment/Plan   Plan  23 y.o. G3P1011 at [redacted]w[redacted]d presents for follow-up OB visit. Reviewed prenatal record including previous visit note.  Supervision of normal pregnancy GBS today. BSUS to confirm presentation today. RTC weekly.      No orders of the defined types were placed in this encounter.  No follow-ups on file.   Future Appointments  Date Time Provider Department Center  07/09/2024 11:15 AM Charma Domino, CNM [facility] None  01/06/2025 10:00 AM Alvia Selinda PARAS, MD [facility] [phone] [place]    For next visit:  Weekly ROB.     Domino Charma, CNM  07/08/24 4:51 PM

## 2024-07-09 ENCOUNTER — Ambulatory Visit (INDEPENDENT_AMBULATORY_CARE_PROVIDER_SITE_OTHER): Admitting: Registered Nurse

## 2024-07-09 ENCOUNTER — Other Ambulatory Visit (HOSPITAL_COMMUNITY)
Admission: RE | Admit: 2024-07-09 | Discharge: 2024-07-09 | Disposition: A | Discharge status: Home / Self Care | Source: Ambulatory Visit | Attending: Registered Nurse | Admitting: Registered Nurse

## 2024-07-09 ENCOUNTER — Encounter: Payer: Self-pay | Admitting: Registered Nurse

## 2024-07-09 ENCOUNTER — Encounter: Admitting: Registered Nurse

## 2024-07-09 VITALS — BP 101/65 | HR 93 | Wt 236.4 lb

## 2024-07-09 DIAGNOSIS — Z113 Encounter for screening for infections with a predominantly sexual mode of transmission: Secondary | ICD-10-CM | POA: Insufficient documentation

## 2024-07-09 DIAGNOSIS — Z3A36 36 weeks gestation of pregnancy: Secondary | ICD-10-CM

## 2024-07-09 DIAGNOSIS — Z3483 Encounter for supervision of other normal pregnancy, third trimester: Secondary | ICD-10-CM

## 2024-07-09 DIAGNOSIS — Z3685 Encounter for antenatal screening for Streptococcus B: Secondary | ICD-10-CM | POA: Diagnosis not present

## 2024-07-09 DIAGNOSIS — O99210 Obesity complicating pregnancy, unspecified trimester: Secondary | ICD-10-CM

## 2024-07-09 NOTE — Progress Notes (Signed)
    Return Prenatal Note   Subjective   23 y.o. G3P1011 at [redacted]w[redacted]d presents for this follow-up prenatal visit.  Patient has no c/o. Patient reports: Movement: Present  Objective   Flow sheet Vitals: Pulse Rate: 93 BP: 101/65 Fundal Height: 37 cm Fetal Heart Rate (bpm): 145 Presentation: Vertex Total weight gain: 6 lb 6.4 oz (2.903 kg)  General Appearance  No acute distress, well appearing, and well nourished Pulmonary   Normal work of breathing Neurologic   Alert and oriented to person, place, and time Psychiatric   Mood and affect within normal limits   Assessment/Plan   Plan  23 y.o. G3P1011 at [redacted]w[redacted]d presents for follow-up OB visit. Reviewed prenatal record including previous visit note.  Supervision of normal pregnancy GBS swab today.  BSUS confirms presentation is vertex. RTC weekly for ROB Reviewed labor warning signs and expectations for birth. Instructed to call office or come to hospital with persistent headache, vision changes, regular contractions, leaking of fluid, decreased fetal movement or vaginal bleeding.        Orders Placed This Encounter  Procedures   Strep Gp B NAA   Return in about 1 week (around 07/16/2024) for ROB.   Future Appointments  Date Time Provider Department Center  01/06/2025 10:00 AM Alvia Selinda PARAS, MD [facility] [phone] [place]    For next visit:  Weekly ROB.     Lauraine Lakes, CNM [facility]

## 2024-07-09 NOTE — Assessment & Plan Note (Signed)
 GBS swab today.  BSUS confirms presentation is vertex. RTC weekly for ROB Reviewed labor warning signs and expectations for birth. Instructed to call office or come to hospital with persistent headache, vision changes, regular contractions, leaking of fluid, decreased fetal movement or vaginal bleeding.

## 2024-07-11 DIAGNOSIS — F330 Major depressive disorder, recurrent, mild: Secondary | ICD-10-CM | POA: Diagnosis not present

## 2024-07-11 DIAGNOSIS — F411 Generalized anxiety disorder: Secondary | ICD-10-CM | POA: Diagnosis not present

## 2024-07-11 LAB — CERVICOVAGINAL ANCILLARY ONLY
Chlamydia: NEGATIVE
Comment: NEGATIVE
Comment: NORMAL
Neisseria Gonorrhea: NEGATIVE

## 2024-07-11 LAB — STREP GP B NAA: Strep Gp B NAA: NEGATIVE

## 2024-07-16 ENCOUNTER — Ambulatory Visit (INDEPENDENT_AMBULATORY_CARE_PROVIDER_SITE_OTHER): Admitting: Registered Nurse

## 2024-07-16 VITALS — BP 113/80 | HR 89 | Wt 232.8 lb

## 2024-07-16 DIAGNOSIS — Z3483 Encounter for supervision of other normal pregnancy, third trimester: Secondary | ICD-10-CM

## 2024-07-16 DIAGNOSIS — Z3A37 37 weeks gestation of pregnancy: Secondary | ICD-10-CM | POA: Diagnosis not present

## 2024-07-16 DIAGNOSIS — Z3A38 38 weeks gestation of pregnancy: Secondary | ICD-10-CM | POA: Insufficient documentation

## 2024-07-16 NOTE — Progress Notes (Signed)
    Return Prenatal Note   Subjective   23 y.o. G3P1011 at [redacted]w[redacted]d presents for this follow-up prenatal visit.  Patient has no c/o. GBS swab from last visit was negative.  Patient reports: Movement: Present Contractions: Irritability  Objective   Flow sheet Vitals: Pulse Rate: 89 BP: 113/80 Fundal Height: 38 cm Fetal Heart Rate (bpm): 155 Presentation: Vertex Total weight gain: 2 lb 12.8 oz (1.27 kg)  General Appearance  No acute distress, well appearing, and well nourished Pulmonary   Normal work of breathing Neurologic   Alert and oriented to person, place, and time Psychiatric   Mood and affect within normal limits   Assessment/Plan   Plan  23 y.o. G3P1011 at [redacted]w[redacted]d presents for follow-up OB visit. Reviewed prenatal record including previous visit note.  Supervision of normal pregnancy GBS negative. BSUS showed cephalic last week. Continues to be cephalic on my clinical exam today.  Reviewed s/s labor, [facility], when to come to the hospital.       No orders of the defined types were placed in this encounter.  Return in 1 week (on 07/23/2024) for Weekly ROB.   Future Appointments  Date Time Provider Department Center  01/06/2025 10:00 AM Alvia Selinda PARAS, MD [facility] [phone] [place]    For next visit:  continue with routine prenatal care     Lauraine Lakes, [facility]  07/16/24 3:19 PM

## 2024-07-16 NOTE — Assessment & Plan Note (Signed)
 GBS negative. BSUS showed cephalic last week. Continues to be cephalic on my clinical exam today.  Reviewed s/s labor, [facility], when to come to the hospital.

## 2024-07-19 DIAGNOSIS — F330 Major depressive disorder, recurrent, mild: Secondary | ICD-10-CM | POA: Diagnosis not present

## 2024-07-19 DIAGNOSIS — F411 Generalized anxiety disorder: Secondary | ICD-10-CM | POA: Diagnosis not present

## 2024-07-19 NOTE — Patient Instructions (Signed)
 Third Trimester of Pregnancy  The third trimester of pregnancy is from week 28 through week 40. This is months 7 through 9. The third trimester is a time when your baby is growing fast. Body changes during your third trimester Your body continues to change during this time. The changes usually go away after your baby is born. Physical changes You will continue to gain weight. You may get stretch marks on your hips, belly, and breasts. Your breasts will keep growing and may hurt. A yellow fluid (colostrum) may leak from your breasts. This is the first milk you're making for your baby. Your hair may grow faster and get thicker. In some cases, you may get hair loss. Your belly button may stick out. You may have more swelling in your hands, face, or ankles. Health changes You may have heartburn. You may feel short of breath. This is caused by the uterus that is now bigger. You may have more aches in the pelvis, back, or thighs. You may have more tingling or numbness in your hands, arms, and legs. You may pee more often. You may have trouble pooping (constipation) or swollen veins in the butt that can itch or get painful (hemorrhoids). Other changes You may have more problems sleeping. You may notice the baby moving lower in your belly (dropping). You may have more fluid coming from your vagina. Your joints may feel loose, and you may have pain around your pelvic bone. Follow these instructions at home: Medicines Take medicines only as told by your health care provider. Some medicines are not safe during pregnancy. Your provider may change the medicines that you take. Do not take any medicines unless told to by your provider. Take a prenatal vitamin that has at least 600 micrograms (mcg) of folic acid. Do not use herbal medicines, illegal drugs, or medicines that are not approved by your provider. Eating and drinking While you're pregnant your body needs additional nutrition to help  support your growing baby. Talk with your provider about your nutritional needs. Activity Most women are able to exercise regularly during pregnancy. Exercise routines may need to change at the end of your pregnancy. Talk to your provider about your activities and exercise routine. Relieving pain and discomfort Rest often with your legs raised if you have leg cramps or low back pain. Take warm sitz baths to soothe pain from hemorrhoids. Use hemorrhoid cream if your provider says it's okay. Wear a good, supportive bra if your breasts hurt. Do not use hot tubs, steam rooms, or saunas. Do not douche. Do not use tampons or scented pads. Safety Talk to your provider before traveling far distances. Wear your seatbelt at all times when you're in a car. Talk to your provider if someone hits you, hurts you, or yells at you. Preparing for birth To prepare for your baby: Take childbirth and breastfeeding classes. Visit the hospital and tour the maternity area. Buy a rear-facing car seat. Learn how to install it in your car. General instructions Avoid cat litter boxes and soil used by cats. These things carry germs that can cause harm to your pregnancy and your baby. Do not drink alcohol, smoke, vape, or use products with nicotine or tobacco in them. If you need help quitting, talk with your provider. Keep all follow-up visits for your third trimester. Your provider will do more exams and tests during this trimester. Write down your questions. Take them to your prenatal visits. Your provider also will: Talk with you about  your overall health. Give you advice or refer you to specialists who can help with different needs, including: Mental health and counseling. Foods and healthy eating. Ask for help if you need help with food. Where to find more information American Pregnancy Association: americanpregnancy.org [employer]: acog.org Office on [employer] Health:  [employer] Contact a health care provider if: You have a headache that does not go away when you take medicine. You have any of these problems: You can't eat or drink. You have nausea and vomiting. You have watery poop (diarrhea) for 2 days or more. You have pain when you pee, or your pee smells bad. You have been sick for 2 days or more and aren't getting better. Contact your provider right away if: You have any of these coming from your vagina: Abnormal discharge. Bad-smelling fluid. Bleeding. Your baby is moving less than usual. You have signs of labor: You have any contractions, belly cramping, or have pain in your pelvis or lower back before 37 weeks of pregnancy (preterm labor). You have regular contractions that are less than 5 minutes apart. Your water breaks. You have symptoms of high blood pressure or preeclampsia. These include: A severe, throbbing headache that does not go away. Sudden or extreme swelling of your face, hands, legs, or feet. Vision problems: You see spots. You have blurry vision. Your eyes are sensitive to light. If you can't reach your provider, go to an urgent care or emergency room. Get help right away if: You faint, become confused, or can't think clearly. You have chest pain or trouble breathing. You have any kind of injury, such as from a fall or a car crash. These symptoms may be an emergency. Call 911 right away. Do not wait to see if the symptoms will go away. Do not drive yourself to the hospital. This information is not intended to replace advice given to you by your health care provider. Make sure you discuss any questions you have with your health care provider. Document Revised: 06/22/2023 Document Reviewed: 01/20/2023 Elsevier Patient Education  2024 [employer].

## 2024-07-25 ENCOUNTER — Ambulatory Visit: Admitting: Obstetrics

## 2024-07-25 VITALS — BP 111/61 | HR 89 | Wt 235.8 lb

## 2024-07-25 DIAGNOSIS — O99210 Obesity complicating pregnancy, unspecified trimester: Secondary | ICD-10-CM

## 2024-07-25 DIAGNOSIS — F17290 Nicotine dependence, other tobacco product, uncomplicated: Secondary | ICD-10-CM

## 2024-07-25 DIAGNOSIS — O0993 Supervision of high risk pregnancy, unspecified, third trimester: Secondary | ICD-10-CM

## 2024-07-25 DIAGNOSIS — Z3A38 38 weeks gestation of pregnancy: Secondary | ICD-10-CM

## 2024-07-25 NOTE — Progress Notes (Signed)
    Return Prenatal Note   Subjective  23 y.o. G3P1011 at [redacted]w[redacted]d presents for this follow-up prenatal visit. Pregnancy notable for elevated BMI, hx of e-cigarette use, hx of A1GDM, and varicella non-immune.   Patient has no concerns.  She is ready for labor. First child born on EDD, is hoping she doesn't have to wait that long this time, declines to have her cervix checked today.  Patient reports: Movement: Present Contractions: Irregular Denies vaginal bleeding or leaking fluid. Objective  Flow sheet Vitals: Pulse Rate: 89 BP: 111/61 Fundal Height: 38 cm Fetal Heart Rate (bpm): 140 Total weight gain: 5 lb 12.8 oz (2.631 kg)  General Appearance  No acute distress, well appearing, and well nourished Pulmonary   Normal work of breathing Neurologic   Alert and oriented to person, place, and time Psychiatric   Mood and affect within normal limits   Assessment/Plan   Plan  23 y.o. G3P1011 at [redacted]w[redacted]d by LMP=11wk US  presents for follow-up OB visit. Reviewed prenatal record including previous visit note.  1. Supervision of high risk pregnancy in third trimester (Primary) 2. Obesity in pregnancy, antepartum 3. Vaping nicotine  dependence, tobacco product 4. [redacted] weeks gestation of pregnancy  -Natural cervical ripening supplements discussed, in AVS -Labor precautions given -Pt strongly wanting delivery soon, briefly discussed PD-IOL if undelivered by 41wks  Return in about 1 week (around 08/01/2024) for ROB.   Future Appointments  Date Time Provider Department Center  08/01/2024  1:15 PM Slaughterbeck, Damien, [place] [facility] None  01/06/2025 10:00 AM Alvia Selinda PARAS, MD [facility] [phone] [place]   For next visit:  continue with routine prenatal care   Estil Mangle, DO [facility]

## 2024-07-30 NOTE — Progress Notes (Unsigned)
    Return Prenatal Note   Subjective   23 y.o. G3P1011 at [redacted]w[redacted]d presents for this follow-up prenatal visit.  Patient had some irregular contractions last night, they had stopped by this morning. She has requested to have her membranes swept today. She reports good fetal movement.  Patient reports: Movement: Present Contractions: Irregular  Objective   Flow sheet Vitals: Pulse Rate: 96 BP: 116/69 Fundal Height: 39 cm Fetal Heart Rate (bpm): 140 Dilation: 3 Effacement (%): 50 Station: -2 Total weight gain: 7 lb 11.2 oz (3.493 kg)  General Appearance  No acute distress, well appearing, and well nourished Pulmonary   Normal work of breathing Neurologic   Alert and oriented to person, place, and time Psychiatric   Mood and affect within normal limits   Assessment/Plan   Plan  23 y.o. G3P1011 at [redacted]w[redacted]d presents for follow-up OB visit. Reviewed prenatal record including previous visit note.  Supervision of high-risk pregnancy Abbi is ready to meet her baby. SVE 3-4/50/-2, membrane swept. Reviewed labor warning signs and expectations for birth. Instructed to call office or come to hospital with persistent headache, vision changes, regular contractions, leaking of fluid, decreased fetal movement or vaginal bleeding.       Future Appointments  Date Time Provider Department Center  08/06/2024  3:55 PM Dominic, Jinnie Jansky, CNM [facility] None  01/06/2025 10:00 AM Alvia Selinda PARAS, MD [facility] [phone] [place]    For next visit:  continue with routine prenatal care     Damien Parsley, CNM [facility] 10/30/258:12 PM

## 2024-08-01 ENCOUNTER — Ambulatory Visit: Admitting: Certified Nurse Midwife

## 2024-08-01 ENCOUNTER — Encounter: Payer: Self-pay | Admitting: Certified Nurse Midwife

## 2024-08-01 VITALS — BP 116/69 | HR 96 | Wt 237.7 lb

## 2024-08-01 DIAGNOSIS — Z3A39 39 weeks gestation of pregnancy: Secondary | ICD-10-CM | POA: Diagnosis not present

## 2024-08-01 DIAGNOSIS — O0993 Supervision of high risk pregnancy, unspecified, third trimester: Secondary | ICD-10-CM

## 2024-08-01 NOTE — Assessment & Plan Note (Signed)
 Dana Murphy is ready to meet her baby. SVE 3-4/50/-2, membrane swept. Reviewed labor warning signs and expectations for birth. Instructed to call office or come to hospital with persistent headache, vision changes, regular contractions, leaking of fluid, decreased fetal movement or vaginal bleeding.

## 2024-08-02 DIAGNOSIS — F330 Major depressive disorder, recurrent, mild: Secondary | ICD-10-CM | POA: Diagnosis not present

## 2024-08-06 ENCOUNTER — Ambulatory Visit (INDEPENDENT_AMBULATORY_CARE_PROVIDER_SITE_OTHER): Admitting: Licensed Practical Nurse

## 2024-08-06 VITALS — BP 136/78 | HR 83 | Wt 239.8 lb

## 2024-08-06 DIAGNOSIS — O0993 Supervision of high risk pregnancy, unspecified, third trimester: Secondary | ICD-10-CM | POA: Diagnosis not present

## 2024-08-06 DIAGNOSIS — Z3A40 40 weeks gestation of pregnancy: Secondary | ICD-10-CM | POA: Diagnosis not present

## 2024-08-06 DIAGNOSIS — O99210 Obesity complicating pregnancy, unspecified trimester: Secondary | ICD-10-CM | POA: Diagnosis not present

## 2024-08-06 NOTE — Progress Notes (Signed)
    Return Prenatal Note   Subjective   23 y.o. Dana Murphy at [redacted]w[redacted]d presents for this follow-up prenatal visit.  Patient having  a lot of pelvic pain, feels ready for pregnancy to be over   Patient reports: Movement: Present Contractions: Irritability  Objective   Flow sheet Vitals: Pulse Rate: 83 BP: 136/78 Fundal Height: 41 cm Fetal Heart Rate (bpm): 147 Presentation: Vertex Dilation: 4 Effacement (%): 70 Station: -3, -2 Total weight gain: 9 lb 12.8 oz (4.445 kg)  General Appearance  No acute distress, well appearing, and well nourished Pulmonary   Normal work of breathing Neurologic   Alert and oriented to person, place, and time Psychiatric   Mood and affect within normal limits   Assessment/Plan   Plan  23 y.o. Dana Murphy at [redacted]w[redacted]d presents for follow-up OB visit. Reviewed prenatal record including previous visit note.  Obesity in pregnancy, antepartum -TWG 9lbs, WNL   Supervision of high-risk pregnancy -RNST -Membranes swept -IOL scheduled nov 11 at 0500, understands method of induction is based on cervical exam and induction time could be postponed based on  unit needs.  -warning signs reviewed       Orders Placed This Encounter  Procedures   Fetal nonstress test   No follow-ups on file.   Future Appointments  Date Time Provider Department Center  01/06/2025 10:00 AM Alvia Selinda PARAS, MD [facility] [phone] [place]    For next visit:  IOL Nov 11 at 0500     JINNIE HERO [facility], CNM  11/09/253:01 PM

## 2024-08-09 ENCOUNTER — Inpatient Hospital Stay
Admission: EM | Admit: 2024-08-09 | Discharge: 2024-08-10 | DRG: 807 | Disposition: A | Discharge status: Home / Self Care | Attending: Registered Nurse | Admitting: Registered Nurse

## 2024-08-09 ENCOUNTER — Encounter: Payer: Self-pay | Admitting: Obstetrics

## 2024-08-09 DIAGNOSIS — Z7982 Long term (current) use of aspirin: Secondary | ICD-10-CM

## 2024-08-09 DIAGNOSIS — Z87891 Personal history of nicotine dependence: Secondary | ICD-10-CM

## 2024-08-09 DIAGNOSIS — E669 Obesity, unspecified: Secondary | ICD-10-CM | POA: Diagnosis not present

## 2024-08-09 DIAGNOSIS — Z3490 Encounter for supervision of normal pregnancy, unspecified, unspecified trimester: Principal | ICD-10-CM | POA: Diagnosis present

## 2024-08-09 DIAGNOSIS — Z3A40 40 weeks gestation of pregnancy: Secondary | ICD-10-CM | POA: Diagnosis not present

## 2024-08-09 DIAGNOSIS — O480 Post-term pregnancy: Secondary | ICD-10-CM | POA: Diagnosis not present

## 2024-08-09 DIAGNOSIS — O0993 Supervision of high risk pregnancy, unspecified, third trimester: Secondary | ICD-10-CM

## 2024-08-09 DIAGNOSIS — O99214 Obesity complicating childbirth: Secondary | ICD-10-CM

## 2024-08-09 DIAGNOSIS — O26893 Other specified pregnancy related conditions, third trimester: Secondary | ICD-10-CM | POA: Diagnosis not present

## 2024-08-09 DIAGNOSIS — Z833 Family history of diabetes mellitus: Secondary | ICD-10-CM | POA: Diagnosis not present

## 2024-08-09 LAB — CBC
HCT: 36.9 % (ref 36.0–46.0)
Hemoglobin: 12.5 g/dL (ref 12.0–15.0)
MCH: 29.9 pg (ref 26.0–34.0)
MCHC: 33.9 g/dL (ref 30.0–36.0)
MCV: 88.3 fL (ref 80.0–100.0)
Platelets: 197 K/uL (ref 150–400)
RBC: 4.18 MIL/uL (ref 3.87–5.11)
RDW: 13.2 % (ref 11.5–15.5)
WBC: 14 K/uL — ABNORMAL HIGH (ref 4.0–10.5)
nRBC: 0 % (ref 0.0–0.2)

## 2024-08-09 LAB — TYPE AND SCREEN
ABO/RH(D): A POS
Antibody Screen: NEGATIVE

## 2024-08-09 MED ORDER — OXYTOCIN-SODIUM CHLORIDE 30-0.9 UT/500ML-% IV SOLN: 2.5000 [IU]/h | INTRAVENOUS | Status: DC

## 2024-08-09 MED ORDER — SIMETHICONE 80 MG PO CHEW: 80.0000 mg | CHEWABLE_TABLET | ORAL | Status: DC | PRN

## 2024-08-09 MED ORDER — OXYTOCIN-SODIUM CHLORIDE 30-0.9 UT/500ML-% IV SOLN
INTRAVENOUS | Status: AC
  Administered 2024-08-09: 333 mL via INTRAVENOUS
  Filled 2024-08-09: qty 500

## 2024-08-09 MED ORDER — LACTATED RINGERS IV SOLN: 500.0000 mL | INTRAVENOUS | Status: DC | PRN

## 2024-08-09 MED ORDER — ONDANSETRON HCL 4 MG/2ML IJ SOLN: 4.0000 mg | Freq: Four times a day (QID) | INTRAMUSCULAR | Status: DC | PRN

## 2024-08-09 MED ORDER — IBUPROFEN 600 MG PO TABS
600.0000 mg | ORAL_TABLET | Freq: Four times a day (QID) | ORAL | Status: DC
  Administered 2024-08-09 – 2024-08-10 (×5): 600 mg via ORAL
  Filled 2024-08-09 (×5): qty 1

## 2024-08-09 MED ORDER — OXYTOCIN 10 UNIT/ML IJ SOLN
INTRAMUSCULAR | Status: AC
  Filled 2024-08-09: qty 2

## 2024-08-09 MED ORDER — LIDOCAINE HCL (PF) 1 % IJ SOLN
INTRAMUSCULAR | Status: AC
  Administered 2024-08-09: 30 mL via SUBCUTANEOUS
  Filled 2024-08-09: qty 30

## 2024-08-09 MED ORDER — BENZOCAINE-MENTHOL 20-0.5 % EX AERO
1.0000 | INHALATION_SPRAY | CUTANEOUS | Status: DC | PRN
  Administered 2024-08-09: 1 via TOPICAL
  Filled 2024-08-09: qty 56

## 2024-08-09 MED ORDER — MISOPROSTOL 200 MCG PO TABS
ORAL_TABLET | ORAL | Status: AC
  Filled 2024-08-09: qty 4

## 2024-08-09 MED ORDER — SOD CITRATE-CITRIC ACID 500-334 MG/5ML PO SOLN: 30.0000 mL | ORAL | Status: DC | PRN

## 2024-08-09 MED ORDER — ONDANSETRON HCL 4 MG/2ML IJ SOLN: 4.0000 mg | INTRAMUSCULAR | Status: DC | PRN

## 2024-08-09 MED ORDER — MAGNESIUM HYDROXIDE 400 MG/5ML PO SUSP: 30.0000 mL | ORAL | Status: DC | PRN

## 2024-08-09 MED ORDER — COCONUT OIL OIL
1.0000 | TOPICAL_OIL | Status: DC | PRN
  Administered 2024-08-10: 1 via TOPICAL
  Filled 2024-08-09: qty 7.5

## 2024-08-09 MED ORDER — VARICELLA VIRUS VACCINE LIVE 1350 PFU/0.5ML IJ SUSR: 0.5000 mL | Freq: Once | INTRAMUSCULAR | Status: DC

## 2024-08-09 MED ORDER — DOCUSATE SODIUM 100 MG PO CAPS
100.0000 mg | ORAL_CAPSULE | Freq: Two times a day (BID) | ORAL | Status: DC
  Administered 2024-08-10: 100 mg via ORAL
  Filled 2024-08-09: qty 1

## 2024-08-09 MED ORDER — LIDOCAINE HCL (PF) 1 % IJ SOLN: 30.0000 mL | INTRAMUSCULAR | Status: AC | PRN

## 2024-08-09 MED ORDER — OXYTOCIN-SODIUM CHLORIDE 30-0.9 UT/500ML-% IV SOLN: 1.0000 m[IU]/min | INTRAVENOUS | Status: DC

## 2024-08-09 MED ORDER — ACETAMINOPHEN 325 MG PO TABS
650.0000 mg | ORAL_TABLET | ORAL | Status: DC | PRN
  Administered 2024-08-09: 650 mg via ORAL
  Filled 2024-08-09: qty 2

## 2024-08-09 MED ORDER — TERBUTALINE SULFATE 1 MG/ML IJ SOLN: 0.2500 mg | Freq: Once | INTRAMUSCULAR | Status: DC | PRN

## 2024-08-09 MED ORDER — WITCH HAZEL-GLYCERIN EX PADS: 1.0000 | MEDICATED_PAD | CUTANEOUS | Status: DC | PRN

## 2024-08-09 MED ORDER — DIPHENHYDRAMINE HCL 25 MG PO CAPS: 25.0000 mg | ORAL_CAPSULE | Freq: Four times a day (QID) | ORAL | Status: DC | PRN

## 2024-08-09 MED ORDER — OXYTOCIN BOLUS FROM INFUSION: 333.0000 mL | Freq: Once | INTRAVENOUS | Status: AC

## 2024-08-09 MED ORDER — HYDROXYZINE HCL 25 MG PO TABS: 50.0000 mg | ORAL_TABLET | Freq: Four times a day (QID) | ORAL | Status: DC | PRN

## 2024-08-09 MED ORDER — AMMONIA AROMATIC IN INHA
RESPIRATORY_TRACT | Status: AC
  Filled 2024-08-09: qty 10

## 2024-08-09 MED ORDER — PRENATAL MULTIVITAMIN CH
1.0000 | ORAL_TABLET | Freq: Every day | ORAL | Status: DC
  Administered 2024-08-09 – 2024-08-10 (×2): 1 via ORAL
  Filled 2024-08-09 (×2): qty 1

## 2024-08-09 MED ORDER — ZOLPIDEM TARTRATE 5 MG PO TABS: 5.0000 mg | ORAL_TABLET | Freq: Every evening | ORAL | Status: DC | PRN

## 2024-08-09 MED ORDER — DIBUCAINE (PERIANAL) 1 % EX OINT: 1.0000 | TOPICAL_OINTMENT | CUTANEOUS | Status: DC | PRN

## 2024-08-09 MED ORDER — LACTATED RINGERS IV SOLN: INTRAVENOUS | Status: DC

## 2024-08-09 MED ORDER — ONDANSETRON HCL 4 MG PO TABS
4.0000 mg | ORAL_TABLET | ORAL | Status: DC | PRN
Start: 2024-08-09 — End: 2024-08-10

## 2024-08-09 NOTE — H&P (Signed)
 History and Physical   HPI  Dana Murphy is a 23 y.o. G3P1011 at [redacted]w[redacted]d Estimated Date of Delivery: 08/05/24 who is being admitted for labor management.    OB History  OB History  Gravida Para Term Preterm AB Living  3 1 1  0 1 1  SAB IAB Ectopic Multiple Live Births  0 0 0 0 1    # Outcome Date GA Lbr Len/2nd Weight Sex Type Anes PTL Lv  3 Current           2 Term 11/02/22 [redacted]w[redacted]d 17:10 / 07:10 3920 g F Vag-Forceps EPI  LIV     Name: Stella,GIRL Ulanda     Apgar1: 8  Apgar5: 9  1 AB 07/21/21 [redacted]w[redacted]d    SAB       PROBLEM LIST  Pregnancy complications or risks: Patient Active Problem List   Diagnosis Date Noted   [redacted] weeks gestation of pregnancy 07/16/2024   History of gestational diabetes in prior pregnancy, currently pregnant 03/25/2024   Other fatigue 01/09/2024   Vaping nicotine  dependence, tobacco product 01/04/2024   Healthcare maintenance 01/04/2024   Supervision of high-risk pregnancy 12/25/2023   Anxiety and depression 10/12/2023   Obesity in pregnancy, antepartum 03/14/2022   Multiple thyroid  nodules 02/26/2020    Prenatal labs and studies: ABO, Rh: A/Positive/-- (04/25 1539) Antibody: Negative (04/25 1539) Rubella: 2.21 (04/25 1539) RPR: Non Reactive (08/26 0839)  HBsAg: Negative (04/25 1539)  HIV: Non Reactive (08/26 0839)  HAD:Wzhjupcz/-- (10/07 1454)   Past Medical History:  Diagnosis Date   Family history of diabetes mellitus (DM) 03/14/2022   Gestational diabetes 2024   no medications needed   Gestational diabetes mellitus (GDM) affecting second pregnancy 09/10/2022     Past Surgical History:  Procedure Laterality Date   NO PAST SURGERIES       Medications    Current Discharge Medication List     CONTINUE these medications which have NOT CHANGED   Details  aspirin  EC 81 MG tablet Take 2 tablets (162 mg total) by mouth daily. Swallow whole. Start at 14 -16 weeks pregnancy Qty: 60 tablet, Refills: 12    Prenatal  Multivit-Min-Fe-FA (PRE-NATAL PO) Take 1 tablet by mouth daily.         Allergies  Patient has no known allergies.  Review of Systems  Constitutional: negative Eyes: negative Ears, nose, mouth, throat, and face: negative Respiratory: negative Cardiovascular: negative Gastrointestinal: negative Genitourinary:negative Integument/breast: negative Hematologic/lymphatic: negative Musculoskeletal:negative Neurological: negative Behavioral/Psych: negative Endocrine: negative Allergic/Immunologic: negative  Physical Exam  LMP 10/30/2023   Lungs:  CTA B Cardio: RRR without M/R/G Abd: Soft, gravid, NT Presentation: cephalic EXT: No C/C/ 1+ Edema DTRs: 2+ B CERVIX:  Pr RN exam 7-8cm  See Prenatal records for more detailed PE.     FHR:  Baseline: 130 bpm, Variability: Good {> 6 bpm), Accelerations: Non-reactive but appropriate for gestational age, and Decelerations: Absent  Toco: Uterine Contractions: Frequency: Every 2-3 minutes and Intensity: strong  Test Results  No results found for this or any previous visit (from the past 24 hours). Group B Strep negative  Assessment   G3P1011 at [redacted]w[redacted]d Estimated Date of Delivery: 08/05/24  The fetus is reassuring.   Patient Active Problem List   Diagnosis Date Noted   [redacted] weeks gestation of pregnancy 07/16/2024   History of gestational diabetes in prior pregnancy, currently pregnant 03/25/2024   Other fatigue 01/09/2024   Vaping nicotine  dependence, tobacco product 01/04/2024  Healthcare maintenance 01/04/2024   Supervision of high-risk pregnancy 12/25/2023   Anxiety and depression 10/12/2023   Obesity in pregnancy, antepartum 03/14/2022   Multiple thyroid  nodules 02/26/2020    Plan  1. Admit to L&D :   2. EFM:-- Category 1 3. Nitrous, iv pain meds or Epidural if desired.   4. Admission labs  5. Anticipate NSVD 6. Dr. Leigh notified of admission  Zelda Hummer, [facility]  08/09/2024 7:33 AM

## 2024-08-09 NOTE — Discharge Summary (Signed)
 Postpartum Discharge Summary   Patient Name: Dana Murphy DOB: 04-May-2001 MRN: [id number]  Date of admission: 08/09/2024 Delivery date:08/09/2024 Delivering provider: SEBASTIAN SHAM Date of discharge: 08/10/2024  Admitting diagnosis: Encounter for induction of labor [Z34.90] Intrauterine pregnancy: [redacted]w[redacted]d     Secondary diagnosis:  Principal Problem:   Encounter for induction of labor Obesity Additional problems: precipitous delivery     Discharge diagnosis: Term Pregnancy Delivered                                              Post partum procedures:none Augmentation: N/A Complications: None  Hospital course: Onset of Labor With Vaginal Delivery      23 y.o. yo [id number] at [redacted]w[redacted]d was admitted in Active Labor on 08/09/2024. Labor course was uncomplicated.  Membrane Rupture Time/Date: 7:32 AM,08/09/2024  Delivery Method:Vaginal, Spontaneous Operative Delivery:N/A Episiotomy: Nonenone Lacerations:  2nd degreesecond degree Patient had a postpartum course that was uncomplicated. She is ambulating, tolerating a regular diet, passing flatus, and urinating well. Patient is discharged home in stable condition on 08/10/24.  Newborn Data: Birth date:08/09/2024 Birth time:7:56 AM Gender:Female Living status:LivingLive Apgars:9 ,9 8,9 Weight:4450 g  Magnesium Sulfate received: No BMZ received: No Rhophylac:N/A MMR:N/A T-DaP:Given prenatally Flu: declined  RSV Vaccine received: Yes Transfusion:No Immunizations administered: Immunization History  Administered Date(s) Administered    sv, Bivalent, Protein Subunit Rsvpref,pf [place]) 06/11/2024   Tdap 08/31/2022, 05/28/2024   Varicella 11/03/2022    Physical exam  Vitals:   08/09/24 1653 08/09/24 1915 08/09/24 2343 08/10/24 0802  BP: 115/60 (!) 108/52 107/65 108/68  Pulse: 73 79 67 82  Resp: 18 18 18    Temp: 99.7 F (37.6 C) 98.3 F (36.8 C) (!) 97.5 F (36.4 C) 98.1 F (36.7 C)  TempSrc: Oral Oral Oral Oral  SpO2:  97%  98% 98%  Weight:      Height:       General: alert, cooperative, and no distress Lochia: appropriate Uterine Fundus: firm Incision: N/A DVT Evaluation: No evidence of DVT seen on physical exam. Labs: Lab Results  Component Value Date   WBC 12.2 (H) 08/10/2024   HGB 10.4 (L) 08/10/2024   HCT 31.2 (L) 08/10/2024   MCV 89.9 08/10/2024   PLT 195 08/10/2024      Latest Ref Rng & Units 12/10/2018    8:20 PM  CMP  Glucose 70 - 99 mg/dL 97   BUN 6 - 20 mg/dL 14   Creatinine 9.55 - 1.00 mg/dL 9.36   Sodium 864 - 854 mmol/L 139   Potassium 3.5 - 5.1 mmol/L 3.9   Chloride 98 - 111 mmol/L 106   CO2 22 - 32 mmol/L 26   Calcium 8.9 - 10.3 mg/dL 9.3   Total Protein 6.5 - 8.1 g/dL 7.3   Total Bilirubin 0.3 - 1.2 mg/dL 0.8   Alkaline Phos 38 - 126 U/L 46   AST 15 - 41 U/L 16   ALT 0 - 44 U/L 15    Edinburgh Score:    12/19/2022    3:26 PM  Edinburgh Postnatal Depression Scale Screening Tool  I have been able to laugh and see the funny side of things. 0   I have looked forward with enjoyment to things. 0   I have blamed myself unnecessarily when things went wrong. 1   I have been anxious or worried for  no good reason. 1   I have felt scared or panicky for no good reason. 1   Things have been getting on top of me. 1   I have been so unhappy that I have had difficulty sleeping. 0   I have felt sad or miserable. 0   I have been so unhappy that I have been crying. 1   The thought of harming myself has occurred to me. 0   Edinburgh Postnatal Depression Scale Total 5      Data saved with a previous flowsheet row definition      After visit meds:  Allergies as of 08/10/2024   No Known Allergies      Medication List     TAKE these medications    acetaminophen  325 MG tablet Commonly known as: Tylenol  Take 2 tablets (650 mg total) by mouth every 4 (four) hours as needed (for pain scale < 4).   benzocaine -Menthol  20-0.5 % Aero Commonly known as: DERMOPLAST Apply 1  Application topically as needed for irritation (perineal discomfort).   dibucaine 1 % Oint Commonly known as: NUPERCAINAL Place 1 Application rectally as needed for hemorrhoids.   docusate sodium  100 MG capsule Commonly known as: COLACE Take 1 capsule (100 mg total) by mouth 2 (two) times daily.   ibuprofen  600 MG tablet Commonly known as: ADVIL  Take 1 tablet (600 mg total) by mouth every 6 (six) hours.   PRE-NATAL PO Take 1 tablet by mouth daily.   simethicone  80 MG chewable tablet Commonly known as: MYLICON Chew 1 tablet (80 mg total) by mouth as needed for flatulence.   witch hazel-glycerin  pad Commonly known as: TUCKS Apply 1 Application topically as needed for hemorrhoids.         Discharge home in stable condition Infant Feeding: Breast Infant Disposition:home with mother Discharge instruction: per After Visit Summary and Postpartum booklet. Activity: Advance as tolerated. Pelvic rest for 6 weeks.  Diet: routine diet Anticipated Birth Control: IUD Postpartum Appointment:2 weeks Additional Postpartum F/U: Postpartum Depression checkup Future Appointments: Future Appointments  Date Time Provider Department Center  01/06/2025 10:00 AM Alvia Selinda PARAS, MD [facility] [phone] [place]   Follow up Visit:  Follow-up Information     [facility]. Schedule an appointment as soon as possible for a visit in 2 week(s).   Specialty: Obstetrics and Gynecology Why: Call to make appointment for telehealth visit in 2 weeks AND 6 week (in person) postpartum visit. Contact information: [address] [place]  [id number] [phone]                    08/10/2024 Lauraine Lakes, CNM

## 2024-08-10 ENCOUNTER — Other Ambulatory Visit: Payer: Self-pay

## 2024-08-10 LAB — CBC
HCT: 31.2 % — ABNORMAL LOW (ref 36.0–46.0)
Hemoglobin: 10.4 g/dL — ABNORMAL LOW (ref 12.0–15.0)
MCH: 30 pg (ref 26.0–34.0)
MCHC: 33.3 g/dL (ref 30.0–36.0)
MCV: 89.9 fL (ref 80.0–100.0)
Platelets: 195 K/uL (ref 150–400)
RBC: 3.47 MIL/uL — ABNORMAL LOW (ref 3.87–5.11)
RDW: 13.2 % (ref 11.5–15.5)
WBC: 12.2 K/uL — ABNORMAL HIGH (ref 4.0–10.5)
nRBC: 0 % (ref 0.0–0.2)

## 2024-08-10 LAB — RPR: RPR Ser Ql: NONREACTIVE

## 2024-08-10 MED ORDER — IBUPROFEN 600 MG PO TABS
600.0000 mg | ORAL_TABLET | Freq: Four times a day (QID) | ORAL | 0 refills | Status: AC
  Filled 2024-08-10: qty 30, 8d supply, fill #0

## 2024-08-10 MED ORDER — BENZOCAINE-MENTHOL 20-0.5 % EX AERO: 1.0000 | INHALATION_SPRAY | CUTANEOUS | Status: AC | PRN

## 2024-08-10 MED ORDER — ACETAMINOPHEN 325 MG PO TABS: 650.0000 mg | ORAL_TABLET | ORAL | Status: AC | PRN

## 2024-08-10 MED ORDER — SIMETHICONE 80 MG PO CHEW
80.0000 mg | CHEWABLE_TABLET | ORAL | 0 refills | Status: AC | PRN
  Filled 2024-08-10: qty 100, 15d supply, fill #0

## 2024-08-10 MED ORDER — DIBUCAINE (PERIANAL) 1 % EX OINT: 1.0000 | TOPICAL_OINTMENT | CUTANEOUS | Status: AC | PRN

## 2024-08-10 MED ORDER — HEMORRHOIDAL HYGIENE 50 % EX PADS
1.0000 | MEDICATED_PAD | CUTANEOUS | 12 refills | Status: AC | PRN
  Filled 2024-08-10: qty 100, 10d supply, fill #0

## 2024-08-10 MED ORDER — DOCUSATE SODIUM 100 MG PO CAPS
100.0000 mg | ORAL_CAPSULE | Freq: Two times a day (BID) | ORAL | 0 refills | Status: AC
  Filled 2024-08-10: qty 10, 5d supply, fill #0

## 2024-08-10 NOTE — Progress Notes (Signed)
 AVS reviewed w/ pt. All questions answered. Pt to discharge home a/t receiving birth certificate.

## 2024-08-10 NOTE — Lactation Note (Signed)
 This note was copied from a baby's chart. Lactation Consultation Note  Patient Name: Dana Murphy Unijb'd Date: 08/10/2024 Age:25 hours Reason for consult: Follow-up assessment;Term   Maternal Data This is mom's 2nd baby, SVD. Mom with history of GDM, obesity, anxiety, and vaping nicotine .  On follow-up visit baby showing feeding cues. Mom was holding off feeding as baby was to have a circ. Per care nurse mom to feed baby having cues as it wasn't time yet for circ. Has patient been taught Hand Expression?: Yes Does the patient have breastfeeding experience prior to this delivery?: Yes How long did the patient breastfeed?: 15 months  Feeding Mother's Current Feeding Choice: Breast Milk Observed mom breastfeeding. Provided tips to optimize positioning and recommended mom adjust baby's lips to insure lips are rolled outward on to areola. Baby with mutiple audible swallows. LATCH Score Latch: Grasps breast easily, tongue down, lips flanged, rhythmical sucking.  Audible Swallowing: Spontaneous and intermittent  Type of Nipple: Everted at rest and after stimulation  Comfort (Breast/Nipple): Soft / non-tender  Hold (Positioning): No assistance needed to correctly position infant at breast.  LATCH Score: 10  Interventions Interventions: Breast feeding basics reviewed;Adjust position;Education;Assisted with latch Discussed with mom who reports she does vape nicotine  how to minimize exposure of nicotine  and second hand exposure to baby from vaping. Mom understands to breastfeed first and immediately after breastfeeding if she chooses to vape that would be the best time to do so. She received well and verbalized understanding of the additional tips provided.  Discharge Discharge Education: Engorgement and breast care;Warning signs for feeding baby;Outpatient recommendation Pump: Personal  Consult Status Consult Status: Complete Date: 08/10/24 Follow-up type: In-patient  Update  provided to care nurse  Dana Murphy 08/10/2024, 4:32 PM

## 2024-08-11 NOTE — Assessment & Plan Note (Signed)
-  TWG 9lbs, WNL

## 2024-08-11 NOTE — Assessment & Plan Note (Signed)
-  RNST -Membranes swept -IOL scheduled nov 11 at 0500, understands method of induction is based on cervical exam and induction time could be postponed based on  unit needs.  -warning signs reviewed

## 2024-08-13 ENCOUNTER — Telehealth: Payer: Self-pay

## 2024-08-13 DIAGNOSIS — O09519 Supervision of elderly primigravida, unspecified trimester: Secondary | ICD-10-CM

## 2024-08-13 NOTE — Progress Notes (Signed)
 Complex Care Management Note Care Guide Note  08/13/2024 Name: Dana Murphy MRN: [id number] DOB: Jun 28, 2001   Complex Care Management Outreach Attempts: An unsuccessful telephone outreach was attempted today to offer the patient information about available complex care management services.  Follow Up Plan:  Additional outreach attempts will be made to offer the patient complex care management information and services.   Encounter Outcome:  Patient Request to Call Back  Dreama [facility]  [facility], [facility] VBCI Assistant Direct Dial: [phone]  Fax: [phone]

## 2024-08-16 NOTE — Progress Notes (Signed)
 Complex Care Management Note Care Guide Note  08/16/2024 Name: Dana Murphy MRN: [id number] DOB: [birth year]   Complex Care Management Outreach Attempts: A second unsuccessful outreach was attempted today to offer the patient with information about available complex care management services.  Follow Up Plan:  Additional outreach attempts will be made to offer the patient complex care management information and services.   Encounter Outcome:  No Answer  Dreama [facility]wood [facility]  [facility], [facility] VBCI Assistant Direct Dial: [phone]  Fax: [phone]

## 2024-08-19 NOTE — Progress Notes (Signed)
 Complex Care Management Note Care Guide Note  08/19/2024 Name: SANDER SPECKMAN MRN: [id number] DOB: [birth year]   Complex Care Management Outreach Attempts: A third unsuccessful outreach was attempted today to offer the patient with information about available complex care management services.  Follow Up Plan:  No further outreach attempts will be made at this time. We have been unable to contact the patient to offer or enroll patient in complex care management services.  Encounter Outcome:  No Answer  Dreama [facility]wood [facility]  [facility], [facility] VBCI Assistant Direct Dial: [phone]  Fax: [phone]

## 2024-08-22 NOTE — Progress Notes (Signed)
   Virtual Visit via Video Note  I connected with Dana Murphy on 08/23/24 at   3:25 PM EST by a video enabled telemedicine application and verified that I am speaking with the correct person using two identifiers.  Location: Patient: Home Provider: [facility] office   I discussed the limitations of evaluation and management by telemedicine and the availability of in person appointments. The patient expressed understanding and agreed to proceed.    History of Present Illness:   Dana Murphy is a 23 y.o. [phone] female who presents for a 2 week televisit for mood check. She is 2 weeks postpartum following a spontaneous vaginal.  The delivery was at 40 gestational weeks.  Postpartum course has been well so far. Baby is feeding by breast. Bleeding: light, without clots or odor. Postpartum depression screening: negative.  EDPS score is 5.      The following portions of the patient's history were reviewed and updated as appropriate: allergies, current medications, past family history, past medical history, past social history, past surgical history, and problem list.   Observations/Objective:   unknown if currently breastfeeding. Gen App: NAD Psych: normal speech, affect. Good mood.        08/23/2024    3:30 PM 08/10/2024    3:47 PM 12/19/2022    3:26 PM 11/22/2022   10:50 AM 11/03/2022    9:30 AM  Edinburgh Postnatal Depression Scale Screening Tool  I have been able to laugh and see the funny side of things. 0 0 0  0  0   I have looked forward with enjoyment to things. 0 0 0  0  0   I have blamed myself unnecessarily when things went wrong. 1 1 1  2  2    I have been anxious or worried for no good reason. 1 1 1  1  1    I have felt scared or panicky for no good reason. 0 1 1  1  1    Things have been getting on top of me. 1 1 1  1  1    I have been so unhappy that I have had difficulty sleeping. 0 1 0  0  0   I have felt sad or miserable. 1 1 0  0  1   I have been so unhappy that I have  been crying. 1 1 1   0  1   The thought of harming myself has occurred to me. 0 0 0  0  0   Edinburgh Postnatal Depression Scale Total 5 7 5  5  7       Data saved with a previous flowsheet row definition         Assessment and Plan:   1. Routine postpartum follow-up (Primary)  2. Postpartum care and examination of lactating mother  3. Encounter for screening for maternal depression  4. Birth control counseling Routine postpartum care, rest as able. Abstinence recommended until IUD placement, placement procedure reviewed.   Follow Up Instructions:     I discussed the assessment and treatment plan with the patient. The patient was provided an opportunity to ask questions and all were answered. The patient agreed with the plan and demonstrated an understanding of the instructions.   The patient was advised to call back or seek an in-person evaluation if the symptoms worsen or if the condition fails to improve as anticipated.   Harlene LITTIE Cisco, CNM

## 2024-08-23 ENCOUNTER — Telehealth: Admitting: Certified Nurse Midwife

## 2024-08-23 DIAGNOSIS — Z3009 Encounter for other general counseling and advice on contraception: Secondary | ICD-10-CM

## 2024-08-23 DIAGNOSIS — Z1332 Encounter for screening for maternal depression: Secondary | ICD-10-CM

## 2024-09-16 DIAGNOSIS — F330 Major depressive disorder, recurrent, mild: Secondary | ICD-10-CM | POA: Diagnosis not present

## 2024-09-19 NOTE — Progress Notes (Unsigned)
° ° °  Post Partum Visit Note  Dana Murphy is a 23 y.o. [id number] female who presents for a postpartum visit. She is 6 weeks postpartum following a normal spontaneous vaginal delivery.  I have fully reviewed the prenatal and intrapartum course. The delivery was at 40.4 gestational weeks.  Anesthesia: none. Postpartum course has been ***. Baby is doing well***. Baby is feeding by {breastmilk/bottle:69}. Bleeding {vag bleed:12292}. Bowel function is {normal:32111}. Bladder function is {normal:32111}. Patient {is/is not:9024} sexually active. Contraception method is {contraceptive method:5051}. Postpartum depression screening: {gen negative/positive:315881}.   The pregnancy intention screening data noted above was reviewed. Potential methods of contraception were discussed. The patient elected to proceed with No data recorded.    Health Maintenance Due  Topic Date Due   HPV VACCINES (1 - 3-dose series) Never done   Pneumococcal Vaccine (1 of 2 - PCV) Never done   Hepatitis B Vaccines 19-59 Average Risk (1 of 3 - 19+ 3-dose series) Never done    {Common ambulatory SmartLinks:19316}  Review of Systems {ros; complete:30496}  Objective:  There were no vitals taken for this visit.   General:  {gen appearance:16600}   Breasts:  {desc; normal/abnormal/not indicated:14647}  Lungs: {lung exam:16931}  Heart:  {heart exam:5510}  Abdomen: {abdomen exam:16834}   Wound {Wound assessment:11097}  GU exam:  {desc; normal/abnormal/not indicated:14647}       Assessment:    There are no diagnoses linked to this encounter.  *** postpartum exam.   Plan:   Essential components of care per ACOG recommendations:  1.  Mood and well being: Patient with {gen negative/positive:315881} depression screening today. Reviewed local resources for support.  - Patient tobacco use? {tobacco use:25506}  - hx of drug use? {yes/no:25505}    2. Infant care and feeding:  -Patient currently breastmilk feeding?  {yes/no:25502}  -Social determinants of health (SDOH) reviewed in EPIC. No concerns***The following needs were identified***  3. Sexuality, contraception and birth spacing - Patient {DOES_DOES WNU:81435} want a pregnancy in the next year.  Desired family size is {NUMBER 1-10:22536} children.  - Reviewed reproductive life planning. Reviewed contraceptive methods based on pt preferences and effectiveness.  Patient desired {Upstream End Methods:24109} today.   - Discussed birth spacing of 18 months  4. Sleep and fatigue -Encouraged family/partner/community support of 4 hrs of uninterrupted sleep to help with mood and fatigue  5. Physical Recovery  - Discussed patients delivery and complications. She describes her labor as {description:25511} - Patient had a {CHL AMB DELIVERY:7782909399}. Patient had a {laceration:25518} laceration. Perineal healing reviewed. Patient expressed understanding - Patient has urinary incontinence? {yes/no:25515} - Patient {ACTION; IS/IS WNU:78978602} safe to resume physical and sexual activity  6.  Health Maintenance - HM due items addressed {Yes or If no, why not?:20788} - Last pap smear  Diagnosis  Date Value Ref Range Status  12/19/2022   Final   - Negative for intraepithelial lesion or malignancy (NILM)   Pap smear {done:10129} at today's visit.  -Breast Cancer screening indicated? {indicated:25516}  7. Chronic Disease/Pregnancy Condition follow up: {Follow up:25499}  - PCP follow up  Camelia Bars, LPN [facility] [place], [facility]

## 2024-09-20 ENCOUNTER — Encounter: Payer: Self-pay | Admitting: Advanced Practice Midwife

## 2024-09-20 ENCOUNTER — Ambulatory Visit: Admitting: Advanced Practice Midwife

## 2024-09-20 DIAGNOSIS — Z30430 Encounter for insertion of intrauterine contraceptive device: Secondary | ICD-10-CM | POA: Diagnosis not present

## 2024-09-20 DIAGNOSIS — Z1332 Encounter for screening for maternal depression: Secondary | ICD-10-CM

## 2024-09-20 MED ORDER — LEVONORGESTREL 20 MCG/DAY IU IUD
1.0000 | INTRAUTERINE_SYSTEM | Freq: Once | INTRAUTERINE | Status: AC
  Administered 2024-09-20: 1 via INTRAUTERINE

## 2024-09-20 NOTE — Progress Notes (Signed)
"  ° °  GYNECOLOGY OFFICE PROCEDURE NOTE  Dana Murphy is a 23 y.o. [id number] here for Mirena  IUD insertion. No GYN concerns.  Last pap smear was on 12/19/2022 and was normal.  The patient is currently using abstinence for contraception and her LMP is No LMP recorded..  The indication for her IUD is contraception/cycle control.  IUD Insertion Procedure Note Patient identified, informed consent performed, consent signed.   Discussed risks of irregular bleeding, cramping, infection, malpositioning, expulsion or uterine perforation of the IUD (1:1000 placements)  which may require further procedure such as laparoscopy.  IUD while effective at preventing pregnancy do not prevent transmission of sexually transmitted diseases and use of barrier methods for this purpose was discussed. Time out was performed.  Urine pregnancy test negative.  Speculum placed in the vagina.  Cervix visualized.  Cleaned with Betadine x 2.  Grasped anteriorly with a single tooth tenaculum.  Uterus sounded to 7.75 cm. IUD placed per manufacturer's recommendations.  Strings trimmed to 3 cm. Tenaculum was removed, good hemostasis noted.  Patient tolerated procedure well.   Patient was given post-procedure instructions.  She was advised to have backup contraception for one week.  Patient was also asked to check IUD strings periodically and follow up in 4-6 weeks for IUD check.  IUD insertion CPT 58300,  Skyla  [id number] Mirena  [id number] Liletta  [id number] Paraguard J7300 Kyleena  [id number] Modifer 25, plus Modifer 79 is done during a global billing visit   Slater Rains, [place] 09/20/2024 6:17 PM    "

## 2024-10-17 NOTE — Progress Notes (Unsigned)
"  ° ° ° ° ° °  IUD String Check  Subjctive: [name] presents for IUD string check.  She had a Mirena  placed 4 weeks ago.  Since placement of her IUD she had *** vaginal bleeding.  She {Actions; denies/reports/admits to:19208} cramping or discomfort.  She {HAS HAS WNU:81165} had intercourse since placement.  She {HAS HAS WNU:81165} checked the strings.  She denies any fever, chills, nausea, vomiting, or other complaints.    Objective: There were no vitals taken for this visit. Gen:  NAD, A&Ox3 HEENT: normocephalic, anicteric Pulmonary: no increased work of breathing Pelvic: Normal appearing external female genitalia, normal vaginal epithelium, no abnormal discharge. Normal appearing cervix.  IUD strings visible and 3 cm in length similar to at the time of placement. Psychiatric: mood appropriate, affect full Neurologic: grossly normal  Female chaperone was present for the entirety of the pelvic exam  Assessment: 24 y.o. year old female status post prior Mirena  IUD placement 4 week ago, doing well.  Plan: 1.  The patient was given instructions to check her IUD strings monthly and call with any problems or concerns.  She should call for fevers, chills, abnormal vaginal discharge, pelvic pain, or other complaints. 2.  She will return for a annual exam in 1 year.  All questions answered.  [place], LPN 8/84/7973 88:99 AM     "

## 2024-10-18 ENCOUNTER — Ambulatory Visit: Admitting: Advanced Practice Midwife

## 2024-10-18 DIAGNOSIS — Z30431 Encounter for routine checking of intrauterine contraceptive device: Secondary | ICD-10-CM

## 2024-11-01 ENCOUNTER — Ambulatory Visit: Admitting: Advanced Practice Midwife

## 2024-11-01 NOTE — Progress Notes (Unsigned)
"  ° ° ° ° ° °  IUD String Check  Subjctive: Dana Murphy presents for IUD string check.  She had a Mirena  placed 6 weeks ago.  Since placement of her IUD she had *** vaginal bleeding.  She {Actions; denies/reports/admits to:19208} cramping or discomfort.  She {HAS HAS WNU:81165} had intercourse since placement.  She {HAS HAS WNU:81165} checked the strings.  She denies any fever, chills, nausea, vomiting, or other complaints.    Objective: There were no vitals taken for this visit. Gen:  NAD, A&Ox3 HEENT: normocephalic, anicteric Pulmonary: no increased work of breathing Pelvic: Normal appearing external female genitalia, normal vaginal epithelium, no abnormal discharge. Normal appearing cervix.  IUD strings visible and 3 cm in length similar to at the time of placement. Psychiatric: mood appropriate, affect full Neurologic: grossly normal  Female chaperone was present for the entirety of the pelvic exam  Assessment: 24 y.o. year old female status post prior Mirena  IUD placement 6 weeks ago, doing well.  Plan: 1.  The patient was given instructions to check her IUD strings monthly and call with any problems or concerns.  She should call for fevers, chills, abnormal vaginal discharge, pelvic pain, or other complaints. 2.  She will return for a annual exam in 1 year.  All questions answered.  [place], [place] 8/69/7973 87:90 PM     "

## 2025-01-06 ENCOUNTER — Encounter: Admitting: Family Medicine
